# Patient Record
Sex: Male | Born: 1945
Health system: Southern US, Community
[De-identification: ages and names within clinical notes are randomized; demographics above are authoritative.]

## PROBLEM LIST (undated history)

## (undated) DIAGNOSIS — J449 Chronic obstructive pulmonary disease, unspecified: Secondary | ICD-10-CM

## (undated) DIAGNOSIS — J439 Emphysema, unspecified: Secondary | ICD-10-CM

## (undated) DIAGNOSIS — Z9889 Other specified postprocedural states: Secondary | ICD-10-CM

## (undated) DIAGNOSIS — A048 Other specified bacterial intestinal infections: Secondary | ICD-10-CM

## (undated) DIAGNOSIS — I719 Aortic aneurysm of unspecified site, without rupture: Secondary | ICD-10-CM

## (undated) DIAGNOSIS — H269 Unspecified cataract: Secondary | ICD-10-CM

## (undated) HISTORY — DX: Chronic obstructive pulmonary disease, unspecified: J44.9

## (undated) HISTORY — DX: Other specified bacterial intestinal infections: A04.8

## (undated) HISTORY — DX: Unspecified cataract: H26.9

## (undated) HISTORY — PX: HERNIA REPAIR: SHX51

## (undated) HISTORY — PX: FRACTURE SURGERY: SHX138

## (undated) HISTORY — PX: CATARACT EXTRACTION, BILATERAL: SHX1313

---

## 2013-05-23 DIAGNOSIS — C4492 Squamous cell carcinoma of skin, unspecified: Secondary | ICD-10-CM

## 2013-05-23 HISTORY — DX: Squamous cell carcinoma of skin, unspecified: C44.92

## 2013-06-19 ENCOUNTER — Ambulatory Visit (INDEPENDENT_AMBULATORY_CARE_PROVIDER_SITE_OTHER): Payer: Medicare Other

## 2013-06-19 DIAGNOSIS — Z23 Encounter for immunization: Secondary | ICD-10-CM

## 2014-03-01 ENCOUNTER — Encounter: Payer: Self-pay | Admitting: Physician Assistant

## 2014-03-01 ENCOUNTER — Ambulatory Visit (INDEPENDENT_AMBULATORY_CARE_PROVIDER_SITE_OTHER): Payer: Medicare Other | Admitting: Physician Assistant

## 2014-03-01 VITALS — BP 128/72 | HR 89 | Temp 99.0°F | Ht 71.0 in | Wt 168.2 lb

## 2014-03-01 DIAGNOSIS — F17200 Nicotine dependence, unspecified, uncomplicated: Secondary | ICD-10-CM | POA: Insufficient documentation

## 2014-03-01 DIAGNOSIS — Z87820 Personal history of traumatic brain injury: Secondary | ICD-10-CM | POA: Insufficient documentation

## 2014-03-01 DIAGNOSIS — Z0000 Encounter for general adult medical examination without abnormal findings: Secondary | ICD-10-CM

## 2014-03-01 NOTE — Progress Notes (Signed)
Subjective:     Patient ID: Spencer Miller, male   DOB: [birth year], 68 y.o.   MRN: [id number]  HPI Pt here for physical exam He has all of his labs done through the [facility] Last labs done in Dec- see scanned documents He is doing well and has no complaints Pt with a new dog and states life has never been better since  Review of Systems  Constitutional: Negative.   HENT: Negative.   Eyes: Negative.   Respiratory: Negative.   Cardiovascular: Negative.   Gastrointestinal: Negative.   Endocrine: Negative.   Genitourinary: Negative.   Musculoskeletal: Negative.   Skin: Negative.   Allergic/Immunologic: Negative.   Neurological: Negative.   Hematological: Negative.   Psychiatric/Behavioral: Negative.        Objective:   Physical Exam  Constitutional: He is oriented to person, place, and time. He appears well-developed and well-nourished.  HENT:  Head: Normocephalic and atraumatic.  Right Ear: External ear normal.  Left Ear: External ear normal.  Nose: Nose normal.  Mouth/Throat: Oropharynx is clear and moist.  Eyes: Conjunctivae and EOM are normal. Pupils are equal, round, and reactive to light.  Neck: Normal range of motion. Neck supple. No JVD present. No thyromegaly present.  Cardiovascular: Normal rate, regular rhythm, normal heart sounds and intact distal pulses.   Pulmonary/Chest: Effort normal and breath sounds normal.  Abdominal: Soft. Bowel sounds are normal. He exhibits no distension and no mass. There is no tenderness. There is no rebound and no guarding.  Musculoskeletal: Normal range of motion. He exhibits no edema and no tenderness.  Lymphadenopathy:    He has no cervical adenopathy.  Neurological: He is alert and oriented to person, place, and time. He has normal reflexes.  Skin: Skin is warm and dry.  Psychiatric: He has a normal mood and affect. His behavior is normal. Judgment and thought content normal.       Assessment:     Physical exam    Plan:      Reviewed his lab form the [facility] with him Pt is up to date on his regular screening tests through the [facility] He has an appt for regular check with them in the next month Stressed the importance of smoking cessation Current with the current level of exercise F/U pending new labs from the [facility]

## 2014-03-01 NOTE — Patient Instructions (Signed)
Smoking Cessation Quitting smoking is important to your health and has many advantages. However, it is not always easy to quit since nicotine is a very addictive drug. Often times, people try 3 times or more before being able to quit. This document explains the best ways for you to prepare to quit smoking. Quitting takes hard work and a lot of effort, but you can do it. ADVANTAGES OF QUITTING SMOKING  You will live longer, feel better, and live better.  Your body will feel the impact of quitting smoking almost immediately.  Within 20 minutes, blood pressure decreases. Your pulse returns to its normal level.  After 8 hours, carbon monoxide levels in the blood return to normal. Your oxygen level increases.  After 24 hours, the chance of having a heart attack starts to decrease. Your breath, hair, and body stop smelling like smoke.  After 48 hours, damaged nerve endings begin to recover. Your sense of taste and smell improve.  After 72 hours, the body is virtually free of nicotine. Your bronchial tubes relax and breathing becomes easier.  After 2 to 12 weeks, lungs can hold more air. Exercise becomes easier and circulation improves.  The risk of having a heart attack, stroke, cancer, or lung disease is greatly reduced.  After 1 year, the risk of coronary heart disease is cut in half.  After 5 years, the risk of stroke falls to the same as a nonsmoker.  After 10 years, the risk of lung cancer is cut in half and the risk of other cancers decreases significantly.  After 15 years, the risk of coronary heart disease drops, usually to the level of a nonsmoker.  If you are pregnant, quitting smoking will improve your chances of having a healthy baby.  The people you live with, especially any children, will be healthier.  You will have extra money to spend on things other than cigarettes. QUESTIONS TO THINK ABOUT BEFORE ATTEMPTING TO QUIT You may want to talk about your answers with your  caregiver.  Why do you want to quit?  If you tried to quit in the past, what helped and what did not?  What will be the most difficult situations for you after you quit? How will you plan to handle them?  Who can help you through the tough times? Your family? Friends? A caregiver?  What pleasures do you get from smoking? What ways can you still get pleasure if you quit? Here are some questions to ask your caregiver:  How can you help me to be successful at quitting?  What medicine do you think would be best for me and how should I take it?  What should I do if I need more help?  What is smoking withdrawal like? How can I get information on withdrawal? GET READY  Set a quit date.  Change your environment by getting rid of all cigarettes, ashtrays, matches, and lighters in your home, car, or work. Do not let people smoke in your home.  Review your past attempts to quit. Think about what worked and what did not. GET SUPPORT AND ENCOURAGEMENT You have a better chance of being successful if you have help. You can get support in many ways.  Tell your family, friends, and co-workers that you are going to quit and need their support. Ask them not to smoke around you.  Get individual, group, or telephone counseling and support. Programs are available at local hospitals and health centers. Call your local health department for   information about programs in your area.  Spiritual beliefs and practices may help some smokers quit.  Download a "quit meter" on your computer to keep track of quit statistics, such as how long you have gone without smoking, cigarettes not smoked, and money saved.  Get a self-help book about quitting smoking and staying off of tobacco. LEARN NEW SKILLS AND BEHAVIORS  Distract yourself from urges to smoke. Talk to someone, go for a walk, or occupy your time with a task.  Change your normal routine. Take a different route to work. Drink tea instead of coffee.  Eat breakfast in a different place.  Reduce your stress. Take a hot bath, exercise, or read a book.  Plan something enjoyable to do every day. Reward yourself for not smoking.  Explore interactive web-based programs that specialize in helping you quit. GET MEDICINE AND USE IT CORRECTLY Medicines can help you stop smoking and decrease the urge to smoke. Combining medicine with the above behavioral methods and support can greatly increase your chances of successfully quitting smoking.  Nicotine replacement therapy helps deliver nicotine to your body without the negative effects and risks of smoking. Nicotine replacement therapy includes nicotine gum, lozenges, inhalers, nasal sprays, and skin patches. Some may be available over-the-counter and others require a prescription.  Antidepressant medicine helps people abstain from smoking, but how this works is unknown. This medicine is available by prescription.  Nicotinic receptor partial agonist medicine simulates the effect of nicotine in your brain. This medicine is available by prescription. Ask your caregiver for advice about which medicines to use and how to use them based on your health history. Your caregiver will tell you what side effects to look out for if you choose to be on a medicine or therapy. Carefully read the information on the package. Do not use any other product containing nicotine while using a nicotine replacement product.  RELAPSE OR DIFFICULT SITUATIONS Most relapses occur within the first 3 months after quitting. Do not be discouraged if you start smoking again. Remember, most people try several times before finally quitting. You may have symptoms of withdrawal because your body is used to nicotine. You may crave cigarettes, be irritable, feel very hungry, cough often, get headaches, or have difficulty concentrating. The withdrawal symptoms are only temporary. They are strongest when you first quit, but they will go away within  10-14 days. To reduce the chances of relapse, try to:  Avoid drinking alcohol. Drinking lowers your chances of successfully quitting.  Reduce the amount of caffeine you consume. Once you quit smoking, the amount of caffeine in your body increases and can give you symptoms, such as a rapid heartbeat, sweating, and anxiety.  Avoid smokers because they can make you want to smoke.  Do not let weight gain distract you. Many smokers will gain weight when they quit, usually less than 10 pounds. Eat a healthy diet and stay active. You can always lose the weight gained after you quit.  Find ways to improve your mood other than smoking. FOR MORE INFORMATION  www.smokefree.gov  Document Released: 08/18/2001 Document Revised: 02/23/2012 Document Reviewed: 12/03/2011 Exit[employer] Patient Information 2015 [employer]. This information is not intended to replace advice given to you by your health care Sandro Burgo. Make sure you discuss any questions you have with your health care Ennifer Harston.  

## 2014-06-19 ENCOUNTER — Ambulatory Visit (INDEPENDENT_AMBULATORY_CARE_PROVIDER_SITE_OTHER): Payer: Medicare Other

## 2014-06-19 DIAGNOSIS — Z23 Encounter for immunization: Secondary | ICD-10-CM

## 2014-12-03 DIAGNOSIS — L929 Granulomatous disorder of the skin and subcutaneous tissue, unspecified: Secondary | ICD-10-CM | POA: Diagnosis not present

## 2014-12-03 DIAGNOSIS — D239 Other benign neoplasm of skin, unspecified: Secondary | ICD-10-CM | POA: Diagnosis not present

## 2015-02-13 ENCOUNTER — Encounter (INDEPENDENT_AMBULATORY_CARE_PROVIDER_SITE_OTHER): Payer: Self-pay

## 2015-02-13 ENCOUNTER — Encounter: Payer: Self-pay | Admitting: *Deleted

## 2015-02-13 ENCOUNTER — Ambulatory Visit (INDEPENDENT_AMBULATORY_CARE_PROVIDER_SITE_OTHER): Payer: Medicare Other | Admitting: *Deleted

## 2015-02-13 VITALS — BP 104/66 | HR 80 | Resp 20 | Ht 71.0 in | Wt 164.2 lb

## 2015-02-13 DIAGNOSIS — Z1212 Encounter for screening for malignant neoplasm of rectum: Secondary | ICD-10-CM

## 2015-02-13 DIAGNOSIS — Z0000 Encounter for general adult medical examination without abnormal findings: Secondary | ICD-10-CM | POA: Diagnosis not present
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
## 2015-02-13 NOTE — Patient Instructions (Addendum)
Preventive Care for Adults A healthy lifestyle and preventive care can promote health and wellness. Preventive health guidelines for men include the following key practices:  A routine yearly physical is a good way to check with your health care provider about your health and preventative screening. It is a chance to share any concerns and updates on your health and to receive a thorough exam.  Visit your dentist for a routine exam and preventative care every 6 months. Brush your teeth twice a day and floss once a day. Good oral hygiene prevents tooth decay and gum disease.  The frequency of eye exams is based on your age, health, family medical history, use of contact lenses, and other factors. Follow your health care provider's recommendations for frequency of eye exams.  Eat a healthy diet. Foods such as vegetables, fruits, whole grains, low-fat dairy products, and lean protein foods contain the nutrients you need without too many calories. Decrease your intake of foods high in solid fats, added sugars, and salt. Eat the right amount of calories for you.Get information about a proper diet from your health care provider, if necessary.  Regular physical exercise is one of the most important things you can do for your health. Most adults should get at least 150 minutes of moderate-intensity exercise (any activity that increases your heart rate and causes you to sweat) each week. In addition, most adults need muscle-strengthening exercises on 2 or more days a week.  Maintain a healthy weight. The body mass index (BMI) is a screening tool to identify possible weight problems. It provides an estimate of body fat based on height and weight. Your health care provider can find your BMI and can help you achieve or maintain a healthy weight.For adults 20 years and older:  A BMI below 18.5 is considered underweight.  A BMI of 18.5 to 24.9 is normal.  A BMI of 25 to 29.9 is considered overweight.  A BMI  of 30 and above is considered obese.  Maintain normal blood lipids and cholesterol levels by exercising and minimizing your intake of saturated fat. Eat a balanced diet with plenty of fruit and vegetables. Blood tests for lipids and cholesterol should begin at age 50 and be repeated every 5 years. If your lipid or cholesterol levels are high, you are over 50, or you are at high risk for heart disease, you may need your cholesterol levels checked more frequently.Ongoing high lipid and cholesterol levels should be treated with medicines if diet and exercise are not working.  If you smoke, find out from your health care provider how to quit. If you do not use tobacco, do not start.  Lung cancer screening is recommended for adults aged 73-80 years who are at high risk for developing lung cancer because of a history of smoking. A yearly low-dose CT scan of the lungs is recommended for people who have at least a 30-pack-year history of smoking and are a current smoker or have quit within the past 15 years. A pack year of smoking is smoking an average of 1 pack of cigarettes a day for 1 year (for example: 1 pack a day for 30 years or 2 packs a day for 15 years). Yearly screening should continue until the smoker has stopped smoking for at least 15 years. Yearly screening should be stopped for people who develop a health problem that would prevent them from having lung cancer treatment.  If you choose to drink alcohol, do not have more than  2 drinks per day. One drink is considered to be 12 ounces (355 mL) of beer, 5 ounces (148 mL) of wine, or 1.5 ounces (44 mL) of liquor.  Avoid use of street drugs. Do not share needles with anyone. Ask for help if you need support or instructions about stopping the use of drugs.  High blood pressure causes heart disease and increases the risk of stroke. Your blood pressure should be checked at least every 1-2 years. Ongoing high blood pressure should be treated with  medicines, if weight loss and exercise are not effective.  If you are 45-79 years old, ask your health care provider if you should take aspirin to prevent heart disease.  Diabetes screening involves taking a blood sample to check your fasting blood sugar level. This should be done once every 3 years, after age 45, if you are within normal weight and without risk factors for diabetes. Testing should be considered at a younger age or be carried out more frequently if you are overweight and have at least 1 risk factor for diabetes.  Colorectal cancer can be detected and often prevented. Most routine colorectal cancer screening begins at the age of 50 and continues through age 75. However, your health care provider may recommend screening at an earlier age if you have risk factors for colon cancer. On a yearly basis, your health care provider may provide home test kits to check for hidden blood in the stool. Use of a small camera at the end of a tube to directly examine the colon (sigmoidoscopy or colonoscopy) can detect the earliest forms of colorectal cancer. Talk to your health care provider about this at age 50, when routine screening begins. Direct exam of the colon should be repeated every 5-10 years through age 75, unless early forms of precancerous polyps or small growths are found.  People who are at an increased risk for hepatitis B should be screened for this virus. You are considered at high risk for hepatitis B if:  You were born in a country where hepatitis B occurs often. Talk with your health care provider about which countries are considered high risk.  Your parents were born in a high-risk country and you have not received a shot to protect against hepatitis B (hepatitis B vaccine).  You have HIV or AIDS.  You use needles to inject street drugs.  You live with, or have sex with, someone who has hepatitis B.  You are a man who has sex with other men (MSM).  You get hemodialysis  treatment.  You take certain medicines for conditions such as cancer, organ transplantation, and autoimmune conditions.  Hepatitis C blood testing is recommended for all people born from 1945 through 1965 and any individual with known risks for hepatitis C.  Practice safe sex. Use condoms and avoid high-risk sexual practices to reduce the spread of sexually transmitted infections (STIs). STIs include gonorrhea, chlamydia, syphilis, trichomonas, herpes, HPV, and human immunodeficiency virus (HIV). Herpes, HIV, and HPV are viral illnesses that have no cure. They can result in disability, cancer, and death.  If you are at risk of being infected with HIV, it is recommended that you take a prescription medicine daily to prevent HIV infection. This is called preexposure prophylaxis (PrEP). You are considered at risk if:  You are a man who has sex with other men (MSM) and have other risk factors.  You are a heterosexual man, are sexually active, and are at increased risk for HIV infection.    You take drugs by injection.  You are sexually active with a partner who has HIV.  Talk with your health care provider about whether you are at high risk of being infected with HIV. If you choose to begin PrEP, you should first be tested for HIV. You should then be tested every 3 months for as long as you are taking PrEP.  A one-time screening for abdominal aortic aneurysm (AAA) and surgical repair of large AAAs by ultrasound are recommended for men ages 32 to 67 years who are current or former smokers.  Healthy men should no longer receive prostate-specific antigen (PSA) blood tests as part of routine cancer screening. Talk with your health care provider about prostate cancer screening.  Testicular cancer screening is not recommended for adult males who have no symptoms. Screening includes self-exam, a health care provider exam, and other screening tests. Consult with your health care provider about any symptoms  you have or any concerns you have about testicular cancer.  Use sunscreen. Apply sunscreen liberally and repeatedly throughout the day. You should seek shade when your shadow is shorter than you. Protect yourself by wearing long sleeves, pants, a wide-brimmed hat, and sunglasses year round, whenever you are outdoors.  Once a month, do a whole-body skin exam, using a mirror to look at the skin on your back. Tell your health care provider about new moles, moles that have irregular borders, moles that are larger than a pencil eraser, or moles that have changed in shape or color.  Stay current with required vaccines (immunizations).  Influenza vaccine. All adults should be immunized every year.  Tetanus, diphtheria, and acellular pertussis (Td, Tdap) vaccine. An adult who has not previously received Tdap or who does not know his vaccine status should receive 1 dose of Tdap. This initial dose should be followed by tetanus and diphtheria toxoids (Td) booster doses every 10 years. Adults with an unknown or incomplete history of completing a 3-dose immunization series with Td-containing vaccines should begin or complete a primary immunization series including a Tdap dose. Adults should receive a Td booster every 10 years.  Varicella vaccine. An adult without evidence of immunity to varicella should receive 2 doses or a second dose if he has previously received 1 dose.  Human papillomavirus (HPV) vaccine. Males aged 68-21 years who have not received the vaccine previously should receive the 3-dose series. Males aged 22-26 years may be immunized. Immunization is recommended through the age of 6 years for any male who has sex with males and did not get any or all doses earlier. Immunization is recommended for any person with an immunocompromised condition through the age of 49 years if he did not get any or all doses earlier. During the 3-dose series, the second dose should be obtained 4-8 weeks after the first  dose. The third dose should be obtained 24 weeks after the first dose and 16 weeks after the second dose.  Zoster vaccine. One dose is recommended for adults aged 50 years or older unless certain conditions are present.  Measles, mumps, and rubella (MMR) vaccine. Adults born before [birth year] generally are considered immune to measles and mumps. Adults born in [birth year] or later should have 1 or more doses of MMR vaccine unless there is a contraindication to the vaccine or there is laboratory evidence of immunity to each of the three diseases. A routine second dose of MMR vaccine should be obtained at least 28 days after the first dose for students attending postsecondary  schools, health care workers, or international travelers. People who received inactivated measles vaccine or an unknown type of measles vaccine during 1963-1967 should receive 2 doses of MMR vaccine. People who received inactivated mumps vaccine or an unknown type of mumps vaccine before 1979 and are at high risk for mumps infection should consider immunization with 2 doses of MMR vaccine. Unvaccinated health care workers born before 1957 who lack laboratory evidence of measles, mumps, or rubella immunity or laboratory confirmation of disease should consider measles and mumps immunization with 2 doses of MMR vaccine or rubella immunization with 1 dose of MMR vaccine.  Pneumococcal 13-valent conjugate (PCV13) vaccine. When indicated, a person who is uncertain of his immunization history and has no record of immunization should receive the PCV13 vaccine. An adult aged 19 years or older who has certain medical conditions and has not been previously immunized should receive 1 dose of PCV13 vaccine. This PCV13 should be followed with a dose of pneumococcal polysaccharide (PPSV23) vaccine. The PPSV23 vaccine dose should be obtained at least 8 weeks after the dose of PCV13 vaccine. An adult aged 19 years or older who has certain medical conditions and  previously received 1 or more doses of PPSV23 vaccine should receive 1 dose of PCV13. The PCV13 vaccine dose should be obtained 1 or more years after the last PPSV23 vaccine dose.  Pneumococcal polysaccharide (PPSV23) vaccine. When PCV13 is also indicated, PCV13 should be obtained first. All adults aged 65 years and older should be immunized. An adult younger than age 65 years who has certain medical conditions should be immunized. Any person who resides in a nursing home or long-term care facility should be immunized. An adult smoker should be immunized. People with an immunocompromised condition and certain other conditions should receive both PCV13 and PPSV23 vaccines. People with human immunodeficiency virus (HIV) infection should be immunized as soon as possible after diagnosis. Immunization during chemotherapy or radiation therapy should be avoided. Routine use of PPSV23 vaccine is not recommended for American Indians, [place] Natives, or people younger than 65 years unless there are medical conditions that require PPSV23 vaccine. When indicated, people who have unknown immunization and have no record of immunization should receive PPSV23 vaccine. One-time revaccination 5 years after the first dose of PPSV23 is recommended for people aged 19-64 years who have chronic kidney failure, nephrotic syndrome, asplenia, or immunocompromised conditions. People who received 1-2 doses of PPSV23 before age 65 years should receive another dose of PPSV23 vaccine at age 65 years or later if at least 5 years have passed since the previous dose. Doses of PPSV23 are not needed for people immunized with PPSV23 at or after age 65 years.  Meningococcal vaccine. Adults with asplenia or persistent complement component deficiencies should receive 2 doses of quadrivalent meningococcal conjugate (MenACWY-D) vaccine. The doses should be obtained at least 2 months apart. Microbiologists working with certain meningococcal bacteria,  military recruits, people at risk during an outbreak, and people who travel to or live in countries with a high rate of meningitis should be immunized. A first-year college student up through age 21 years who is living in a residence hall should receive a dose if he did not receive a dose on or after his 16th birthday. Adults who have certain high-risk conditions should receive one or more doses of vaccine.  Hepatitis A vaccine. Adults who wish to be protected from this disease, have certain high-risk conditions, work with hepatitis A-infected animals, work in hepatitis A research labs, or   travel to or work in countries with a high rate of hepatitis A should be immunized. Adults who were previously unvaccinated and who anticipate close contact with an international adoptee during the first 60 days after arrival in the [place] from a country with a high rate of hepatitis A should be immunized.  Hepatitis B vaccine. Adults should be immunized if they wish to be protected from this disease, have certain high-risk conditions, may be exposed to blood or other infectious body fluids, are household contacts or sex partners of hepatitis B positive people, are clients or workers in certain care facilities, or travel to or work in countries with a high rate of hepatitis B.  Haemophilus influenzae type b (Hib) vaccine. A previously unvaccinated person with asplenia or sickle cell disease or having a scheduled splenectomy should receive 1 dose of Hib vaccine. Regardless of previous immunization, a recipient of a hematopoietic stem cell transplant should receive a 3-dose series 6-12 months after his successful transplant. Hib vaccine is not recommended for adults with HIV infection. Preventive Service / Frequency Ages 52 to 17  Blood pressure check.** / Every 1 to 2 years.  Lipid and cholesterol check.** / Every 5 years beginning at age 69.  Hepatitis C blood test.** / For any individual with known risks for  hepatitis C.  Skin self-exam. / Monthly.  Influenza vaccine. / Every year.  Tetanus, diphtheria, and acellular pertussis (Tdap, Td) vaccine.** / Consult your health care provider. 1 dose of Td every 10 years.  Varicella vaccine.** / Consult your health care provider.  HPV vaccine. / 3 doses over 6 months, if 72 or younger.  Measles, mumps, rubella (MMR) vaccine.** / You need at least 1 dose of MMR if you were born in 1957 or later. You may also need a second dose.  Pneumococcal 13-valent conjugate (PCV13) vaccine.** / Consult your health care provider.  Pneumococcal polysaccharide (PPSV23) vaccine.** / 1 to 2 doses if you smoke cigarettes or if you have certain conditions.  Meningococcal vaccine.** / 1 dose if you are age 35 to 60 years and a Market researcher living in a residence hall, or have one of several medical conditions. You may also need additional booster doses.  Hepatitis A vaccine.** / Consult your health care provider.  Hepatitis B vaccine.** / Consult your health care provider.  Haemophilus influenzae type b (Hib) vaccine.** / Consult your health care provider. Ages 35 to 8  Blood pressure check.** / Every 1 to 2 years.  Lipid and cholesterol check.** / Every 5 years beginning at age 57.  Lung cancer screening. / Every year if you are aged 44-80 years and have a 30-pack-year history of smoking and currently smoke or have quit within the past 15 years. Yearly screening is stopped once you have quit smoking for at least 15 years or develop a health problem that would prevent you from having lung cancer treatment.  Fecal occult blood test (FOBT) of stool. / Every year beginning at age 55 and continuing until age 73. You may not have to do this test if you get a colonoscopy every 10 years.  Flexible sigmoidoscopy** or colonoscopy.** / Every 5 years for a flexible sigmoidoscopy or every 10 years for a colonoscopy beginning at age 28 and continuing until age  1.  Hepatitis C blood test.** / For all people born from [birth year] through 1965 and any individual with known risks for hepatitis C.  Skin self-exam. / Monthly.  Influenza vaccine. / Every  year.  Tetanus, diphtheria, and acellular pertussis (Tdap/Td) vaccine.** / Consult your health care provider. 1 dose of Td every 10 years.  Varicella vaccine.** / Consult your health care provider.  Zoster vaccine.** / 1 dose for adults aged 73 years or older.  Measles, mumps, rubella (MMR) vaccine.** / You need at least 1 dose of MMR if you were born in 1957 or later. You may also need a second dose.  Pneumococcal 13-valent conjugate (PCV13) vaccine.** / Consult your health care provider.  Pneumococcal polysaccharide (PPSV23) vaccine.** / 1 to 2 doses if you smoke cigarettes or if you have certain conditions.  Meningococcal vaccine.** / Consult your health care provider.  Hepatitis A vaccine.** / Consult your health care provider.  Hepatitis B vaccine.** / Consult your health care provider.  Haemophilus influenzae type b (Hib) vaccine.** / Consult your health care provider. Ages 85 and over  Blood pressure check.** / Every 1 to 2 years.  Lipid and cholesterol check.**/ Every 5 years beginning at age 38.  Lung cancer screening. / Every year if you are aged 99-80 years and have a 30-pack-year history of smoking and currently smoke or have quit within the past 15 years. Yearly screening is stopped once you have quit smoking for at least 15 years or develop a health problem that would prevent you from having lung cancer treatment.  Fecal occult blood test (FOBT) of stool. / Every year beginning at age 16 and continuing until age 26. You may not have to do this test if you get a colonoscopy every 10 years.  Flexible sigmoidoscopy** or colonoscopy.** / Every 5 years for a flexible sigmoidoscopy or every 10 years for a colonoscopy beginning at age 22 and continuing until age 16.  Hepatitis C blood  test.** / For all people born from [birth year] through 1965 and any individual with known risks for hepatitis C.  Abdominal aortic aneurysm (AAA) screening.** / A one-time screening for ages 73 to 33 years who are current or former smokers.  Skin self-exam. / Monthly.  Influenza vaccine. / Every year.  Tetanus, diphtheria, and acellular pertussis (Tdap/Td) vaccine.** / 1 dose of Td every 10 years.  Varicella vaccine.** / Consult your health care provider.  Zoster vaccine.** / 1 dose for adults aged 42 years or older.  Pneumococcal 13-valent conjugate (PCV13) vaccine.** / Consult your health care provider.  Pneumococcal polysaccharide (PPSV23) vaccine.** / 1 dose for all adults aged 36 years and older.  Meningococcal vaccine.** / Consult your health care provider.  Hepatitis A vaccine.** / Consult your health care provider.  Hepatitis B vaccine.** / Consult your health care provider.  Haemophilus influenzae type b (Hib) vaccine.** / Consult your health care provider. **Family history and personal history of risk and conditions may change your health care provider's recommendations. Document Released: 10/20/2001 Document Revised: 08/29/2013 Document Reviewed: 01/19/2011 [facility] Patient Information 2015 [place], [place]. This information is not intended to replace advice given to you by your health care provider. Make sure you discuss any questions you have with your health care provider. Advance Directive Advance directives are the legal documents that allow you to make choices about your health care and medical treatment if you cannot speak for yourself. Advance directives are a way for you to communicate your wishes to family, friends, and health care providers. The specified people can then convey your decisions about end-of-life care to avoid confusion if you should become unable to communicate. Ideally, the process of discussing and writing advance directives should happen  over time rather  than making decisions all at once. Advance directives can be modified as your situation changes, and you can change your mind at any time, even after you have signed the advance directives. Each state has its own laws regarding advance directives. You may want to check with your health care provider, attorney, or state representative about the law in your state. Below are some examples of advance directives. LIVING WILL A living will is a set of instructions documenting your wishes about medical care when you cannot care for yourself. It is used if you become:  Terminally ill.  Incapacitated.  Unable to communicate.  Unable to make decisions. Items to consider in your living will include:  The use or non-use of life-sustaining equipment, such as dialysis machines and breathing machines (ventilators).  A do not resuscitate (DNR) order, which is the instruction not to use cardiopulmonary resuscitation (CPR) if breathing or heartbeat stops.  Tube feeding.  Withholding of food and fluids.  Comfort (palliative) care when the goal becomes comfort rather than a cure.  Organ and tissue donation. A living will does not give instructions about distribution of your money and property if you should pass away. It is advisable to seek the expert advice of a lawyer in drawing up a will regarding your possessions. Decisions about taxes, beneficiaries, and asset distribution will be legally binding. This process can relieve your family and friends of any burdens surrounding disputes or questions that may come up about the allocation of your assets. DO NOT RESUSCITATE (DNR) A do not resuscitate (DNR) order is a request to not have CPR in the event that your heart stops beating or you stop breathing. Unless given other instructions, a health care provider will try to help any patient whose heart has stopped or who has stopped breathing.  HEALTH CARE PROXY AND DURABLE POWER OF ATTORNEY FOR HEALTH CARE A  health care proxy is a person (agent) appointed to make medical decisions for you if you cannot. Generally, people choose someone they know well and trust to represent their preferences when they can no longer do so. You should be sure to ask this person for agreement to act as your agent. An agent may have to exercise judgment in the event of a medical decision for which your wishes are not known. The durable power of attorney for health care is the legal document that names your health care proxy. Once written, it should be:  Signed.  Notarized.  Dated.  Copied.  Witnessed.  Incorporated into your medical record. You may also want to appoint someone to manage your financial affairs if you cannot. This is called a durable power of attorney for finances. It is a separate legal document from the durable power of attorney for health care. You may choose the same person or someone different from your health care proxy to act as your agent in financial matters. Document Released: 12/01/2007 Document Revised: 08/29/2013 Document Reviewed: 01/11/2013 [facility] Patient Information 2015 [place], [place]. This information is not intended to replace advice given to you by your health care provider. Make sure you discuss any questions you have with your health care provider.

## 2015-02-19 ENCOUNTER — Other Ambulatory Visit: Payer: Medicare Other

## 2015-02-19 DIAGNOSIS — Z1212 Encounter for screening for malignant neoplasm of rectum: Secondary | ICD-10-CM | POA: Diagnosis not present

## 2015-02-19 NOTE — Addendum Note (Signed)
Addended by: Pollyann Kennedy F on: 02/19/2015 03:35 PM   Modules accepted: Orders

## 2015-02-19 NOTE — Progress Notes (Signed)
Lab only 

## 2015-02-20 LAB — FECAL OCCULT BLOOD, IMMUNOCHEMICAL: Fecal Occult Bld: NEGATIVE

## 2015-03-10 ENCOUNTER — Encounter: Payer: Self-pay | Admitting: Family Medicine

## 2015-06-06 ENCOUNTER — Ambulatory Visit (INDEPENDENT_AMBULATORY_CARE_PROVIDER_SITE_OTHER): Payer: Medicare Other

## 2015-06-06 DIAGNOSIS — Z23 Encounter for immunization: Secondary | ICD-10-CM | POA: Diagnosis not present

## 2015-11-26 DIAGNOSIS — D485 Neoplasm of uncertain behavior of skin: Secondary | ICD-10-CM | POA: Diagnosis not present

## 2015-11-26 DIAGNOSIS — L570 Actinic keratosis: Secondary | ICD-10-CM | POA: Diagnosis not present

## 2015-11-26 DIAGNOSIS — C44329 Squamous cell carcinoma of skin of other parts of face: Secondary | ICD-10-CM | POA: Diagnosis not present

## 2016-01-09 DIAGNOSIS — D0439 Carcinoma in situ of skin of other parts of face: Secondary | ICD-10-CM | POA: Diagnosis not present

## 2016-03-11 ENCOUNTER — Encounter: Payer: Self-pay | Admitting: Physician Assistant

## 2016-03-11 DIAGNOSIS — Z1211 Encounter for screening for malignant neoplasm of colon: Secondary | ICD-10-CM | POA: Diagnosis not present

## 2016-03-11 DIAGNOSIS — Z1212 Encounter for screening for malignant neoplasm of rectum: Secondary | ICD-10-CM | POA: Diagnosis not present

## 2016-03-20 LAB — COLOGUARD: Cologuard: NEGATIVE

## 2016-05-19 ENCOUNTER — Telehealth: Payer: Self-pay | Admitting: Family Medicine

## 2016-06-02 ENCOUNTER — Ambulatory Visit (INDEPENDENT_AMBULATORY_CARE_PROVIDER_SITE_OTHER): Payer: Medicare Other

## 2016-06-02 DIAGNOSIS — Z23 Encounter for immunization: Secondary | ICD-10-CM | POA: Diagnosis not present

## 2016-07-02 ENCOUNTER — Ambulatory Visit (INDEPENDENT_AMBULATORY_CARE_PROVIDER_SITE_OTHER): Payer: Medicare Other | Admitting: *Deleted

## 2016-07-02 VITALS — BP 125/74 | HR 78 | Ht 71.0 in | Wt 165.0 lb

## 2016-07-02 DIAGNOSIS — Z0000 Encounter for general adult medical examination without abnormal findings: Secondary | ICD-10-CM | POA: Diagnosis not present

## 2016-07-02 NOTE — Patient Instructions (Addendum)
  Mr. Seman ,  Thank you for taking time to come for your Medicare Wellness Visit. I appreciate your ongoing commitment to your health goals. Please review the following plan we discussed and let me know if I can assist you in the future.   Continue to stay active and eat a healthy diet.  Review Advanced Directives. Bring a copy to our office if you have it signed and notarized.   Follow up with Dr Wendi Snipes for physical exam on 07/13/16.  This is a list of the screening recommended for you and due dates:  Health Maintenance  Topic Date Due  .  Hepatitis C: One time screening is recommended by Center for Disease Control  (CDC) for  adults born from [birth year] through 1964.   21-Jan-1946  . Pneumonia vaccines (2 of 2 - PPSV23) 02/26/2016  . Cologuard (Stool DNA test)  03/12/2019  . Tetanus Vaccine  05/26/2021  . Flu Shot  Completed  . Shingles Vaccine  Completed

## 2016-07-06 NOTE — Progress Notes (Signed)
Subjective:   Spencer Miller is a 70 y.o. male who presents for a Subsequent Medicare Annual Wellness Visit. Spencer Miller lives at home with his wife and dog. He is retired from [employer]. He has 4 sons.   Review of Systems  He reports that his health is about the same as last year.   Cardiac Risk Factors include: advanced age (>26men, >23 women);male gender;smoking/ tobacco exposure  Other systems negative.   Objective:    Today's Vitals   07/02/16 1614  BP: 125/74  Pulse: 78  Weight: 165 lb (74.8 kg)  Height: 5\' 11"  (1.803 m)   Body mass index is 23.01 kg/m.  Current Medications (verified) No outpatient encounter prescriptions on file as of 07/02/2016.   No facility-administered encounter medications on file as of 07/02/2016.     Allergies (verified) Review of patient's allergies indicates no known allergies.   History: Past Medical History:  Diagnosis Date  . Cataract    Bilateral    Past Surgical History:  Procedure Laterality Date  . CATARACT EXTRACTION, BILATERAL    . FRACTURE SURGERY    . HERNIA REPAIR     Family History  Problem Relation Age of Onset  . Obesity Mother   . Melanoma Father   . Cancer Brother     stomach  . Obesity Sister   . Obesity Sister   . Obesity Sister   . Healthy Son   . Healthy Son   . Healthy Son   . Healthy Son    Social History   Occupational History  .      Retired [employer] 25 years  .      Retired [employer] 15 years   Social History Main Topics  . Smoking status: Current Every Day Smoker    Packs/day: 1.00    Years: 40.00    Types: Cigarettes  . Smokeless tobacco: Never Used  . Alcohol use 1.2 oz/week    2 Cans of beer per week  . Drug use: No  . Sexual activity: No   Tobacco Counseling Not ready to quite at this time  Activities of Daily Living In your present state of health, do you have any difficulty performing the following activities: 07/02/2016  Hearing? Y  Vision? N    Difficulty concentrating or making decisions? N  Walking or climbing stairs? N  Dressing or bathing? N  Doing errands, shopping? N  Preparing Food and eating ? N  Using the Toilet? N  In the past six months, have you accidently leaked urine? N  Do you have problems with loss of bowel control? N  Managing your Medications? N  Managing your Finances? N  Housekeeping or managing your Housekeeping? N  Some recent data might be hidden    Immunizations and Health Maintenance Immunization History  Administered Date(s) Administered  . Influenza,inj,Quad PF,36+ Mos 06/19/2013, 06/19/2014, 06/06/2015, 06/02/2016  . Pneumococcal Conjugate-13 02/26/2015  . Pneumococcal Polysaccharide-23 01/05/2010  . Tdap 05/27/2011  . Zoster 02/06/2011   Health Maintenance Due  Topic Date Due  . Hepatitis C Screening  [birth year]  . PNA vac Low Risk Adult (2 of 2 - PPSV23) 02/26/2016    Patient Care Team: Timmothy Euler, MD as PCP - General (Family Medicine)   [place] for eye exams.     Assessment:   This is a routine wellness examination for Spencer Miller.   Hearing/Vision screen No hearing or vision deficits noted during exam.  Dietary issues and exercise activities  discussed: Current Exercise Habits: Home exercise routine, Type of exercise: walking, Time (Minutes): 30, Frequency (Times/Week): >7, Weekly Exercise (Minutes/Week): 0, Intensity: Moderate, Exercise limited by: None identified   Reports walking his dog a few times a day and he stays busy around his home. He states that he could use more exercise but doesn't like to "exercise." He prefers to get his exercise through work. For example, he push mows his yard instead of riding.   Goals    . Quit smoking / using tobacco      Depression Screen PHQ 2/9 Scores 07/02/2016 02/13/2015  PHQ - 2 Score 0 0    Fall Risk Fall Risk  07/02/2016 02/13/2015  Falls in the past year? No No    Cognitive Function: MMSE - Mini Mental State Exam  07/02/2016 02/13/2015  Orientation to time 5 5  Orientation to Place 5 5  Registration 3 3  Attention/ Calculation 5 5  Recall 3 2  Language- name 2 objects 2 2  Language- repeat 1 1  Language- follow 3 step command 3 3  Language- read & follow direction 1 1  Write a sentence 1 1  Copy design 1 1  Total score 30 29      normal exam  Screening Tests Health Maintenance  Topic Date Due  . Hepatitis C Screening  [birth year]  . PNA vac Low Risk Adult (2 of 2 - PPSV23) 02/26/2016  . Fecal DNA (Cologuard)  03/12/2019  . TETANUS/TDAP  05/26/2021  . INFLUENZA VACCINE  Completed  . ZOSTAVAX  Completed        Plan:   Physical exam scheduled for 07/13/16 with Dr Wendi Snipes.  During the course of the visit Spencer Miller was educated and counseled about the following appropriate screening and preventive services:   Vaccines to include Pneumoccal-up to date Influenza-up to date, Td-up to date, Zostavax-up to date  Colorectal cancer screening-cologuard done 03/11/16  Cardiovascular disease screening  Diabetes screening  Glaucoma screening-[facility]  Nutrition counseling  Prostate cancer screening- Due at physical exam appt  Smoking cessation counseling  Patient Instructions (the written plan) were given to the patient.   Chong Sicilian, RN  07/06/2016    I have reviewed and agree with the above AWV documentation.   Laroy Apple, MD [facility] 07/09/2016, 9:11 AM

## 2016-07-13 ENCOUNTER — Ambulatory Visit (INDEPENDENT_AMBULATORY_CARE_PROVIDER_SITE_OTHER): Payer: Medicare Other | Admitting: Family Medicine

## 2016-07-13 ENCOUNTER — Encounter: Payer: Self-pay | Admitting: Family Medicine

## 2016-07-13 DIAGNOSIS — N401 Enlarged prostate with lower urinary tract symptoms: Secondary | ICD-10-CM

## 2016-07-13 DIAGNOSIS — N400 Enlarged prostate without lower urinary tract symptoms: Secondary | ICD-10-CM | POA: Insufficient documentation

## 2016-07-13 DIAGNOSIS — N3943 Post-void dribbling: Secondary | ICD-10-CM | POA: Diagnosis not present

## 2016-07-13 NOTE — Progress Notes (Signed)
   HPI  Patient presents today here to discuss prostate tests.  Patient explains that over the last year or so he's had slowly progressive post void dribbling. He denies weak stream, incomplete voiding, or more than one episode of nocturia. He does not necessarily want to try medications but he does want to discuss prostate issues.  He gets his annual exams at the [place]. He has recently had a physical exam at the [place].  He does not take any medications except for oral oregano.  PMH: Smoking status noted ROS: Per HPI  Objective: BP 115/74   Pulse 82   Temp 97.6 F (36.4 C) (Oral)   Ht 5\' 11"  (1.803 m)   Wt 170 lb 3.2 oz (77.2 kg)   BMI 23.74 kg/m  Gen: NAD, alert, cooperative with exam HEENT: NCAT CV: RRR, good S1/S2, no murmur Resp: CTABL, no wheezes, non-labored Ext: No edema, warm Neuro: Alert and oriented, No gross deficits DRE Normal anal sphincter tone Enlarged smooth normal consistency prostate, no tenderness to palpation   Assessment and plan:  # BPH Mild symptoms, ;large on exam I-PSS score= 1 PSA [pending No meds necessary RTC as needed     Orders Placed This Encounter  Procedures  . [place], MD [facility] 07/13/2016, 2:55 PM

## 2016-07-14 LAB — PSA: Prostate Specific Ag, Serum: 0.8 ng/mL (ref 0.0–4.0)

## 2016-11-24 ENCOUNTER — Other Ambulatory Visit (INDEPENDENT_AMBULATORY_CARE_PROVIDER_SITE_OTHER): Payer: PPO

## 2016-11-24 DIAGNOSIS — L720 Epidermal cyst: Secondary | ICD-10-CM | POA: Diagnosis not present

## 2016-11-24 DIAGNOSIS — L821 Other seborrheic keratosis: Secondary | ICD-10-CM | POA: Diagnosis not present

## 2016-11-24 DIAGNOSIS — D492 Neoplasm of unspecified behavior of bone, soft tissue, and skin: Secondary | ICD-10-CM | POA: Diagnosis not present

## 2016-11-24 DIAGNOSIS — Z1211 Encounter for screening for malignant neoplasm of colon: Secondary | ICD-10-CM

## 2016-11-24 DIAGNOSIS — L570 Actinic keratosis: Secondary | ICD-10-CM | POA: Diagnosis not present

## 2016-11-24 DIAGNOSIS — L922 Granuloma faciale [eosinophilic granuloma of skin]: Secondary | ICD-10-CM | POA: Diagnosis not present

## 2016-11-24 DIAGNOSIS — D229 Melanocytic nevi, unspecified: Secondary | ICD-10-CM | POA: Diagnosis not present

## 2016-11-27 LAB — FECAL OCCULT BLOOD, IMMUNOCHEMICAL: Fecal Occult Bld: NEGATIVE

## 2017-09-09 ENCOUNTER — Ambulatory Visit (INDEPENDENT_AMBULATORY_CARE_PROVIDER_SITE_OTHER): Payer: PPO | Admitting: Family Medicine

## 2017-09-09 ENCOUNTER — Encounter: Payer: Self-pay | Admitting: Family Medicine

## 2017-09-09 VITALS — BP 112/76 | HR 95 | Temp 97.4°F | Ht 71.0 in | Wt 163.0 lb

## 2017-09-09 DIAGNOSIS — J0110 Acute frontal sinusitis, unspecified: Secondary | ICD-10-CM | POA: Diagnosis not present

## 2017-09-09 MED ORDER — PREDNISONE 20 MG PO TABS: ORAL_TABLET | ORAL | 0 refills | Status: DC

## 2017-09-09 MED ORDER — AZITHROMYCIN 250 MG PO TABS: ORAL_TABLET | ORAL | 0 refills | Status: DC

## 2017-09-09 NOTE — Progress Notes (Signed)
BP 112/76   Pulse 95   Temp (!) 97.4 F (36.3 C) (Oral)   Ht 5\' 11"  (1.803 m)   Wt 163 lb (73.9 kg)   BMI 22.73 kg/m    Subjective:    Patient ID: Spencer Miller., male    DOB: 1946/02/06, 72 y.o.   MRN: [id number]  HPI: Spencer Miller. is a 72 y.o. male presenting on 09/09/2017 for Sinusitis (sinus congestion, ears stopped up; began after a recent trip to [place])   HPI Sinus congestion and pressure Patient has been having sinus congestion and pressure and feeling like his ears stopped up that is been going on over the past week.  He feels like this started when he took a trip with multiple plane flights to get the pollen but on the way back and only worsened.  He denies having any fevers or chills or shortness of breath or wheezing.  He says mostly the pressure and congestion is in his head and his ears.  He feels like his ears are muffled as well.  He has been using over-the-counter Flonase to help with it which has been helping some but has not cleared it up.  Relevant past medical, surgical, family and social history reviewed and updated as indicated. Interim medical history since our last visit reviewed. Allergies and medications reviewed and updated.  Review of Systems  Constitutional: Negative for chills and fever.  HENT: Positive for congestion, ear pain, postnasal drip, rhinorrhea, sinus pressure and sore throat. Negative for ear discharge, sneezing and voice change.   Eyes: Negative for pain, discharge, redness and visual disturbance.  Respiratory: Positive for cough. Negative for shortness of breath and wheezing.   Cardiovascular: Negative for chest pain and leg swelling.  Musculoskeletal: Negative for gait problem.  Skin: Negative for rash.  All other systems reviewed and are negative.   Per HPI unless specifically indicated above        Objective:    BP 112/76   Pulse 95   Temp (!) 97.4 F (36.3 C) (Oral)   Ht 5\' 11"  (1.803 m)   Wt 163 lb (73.9 kg)    BMI 22.73 kg/m   Wt Readings from Last 3 Encounters:  09/09/17 163 lb (73.9 kg)  07/13/16 170 lb 3.2 oz (77.2 kg)  07/02/16 165 lb (74.8 kg)    Physical Exam  Constitutional: He is oriented to person, place, and time. He appears well-developed and well-nourished. No distress.  HENT:  Right Ear: Tympanic membrane, external ear and ear canal normal.  Left Ear: Tympanic membrane, external ear and ear canal normal.  Nose: Mucosal edema and rhinorrhea present. No sinus tenderness. No epistaxis. Right sinus exhibits maxillary sinus tenderness and frontal sinus tenderness. Left sinus exhibits maxillary sinus tenderness and frontal sinus tenderness.  Mouth/Throat: Uvula is midline and mucous membranes are normal. Posterior oropharyngeal edema and posterior oropharyngeal erythema present. No oropharyngeal exudate or tonsillar abscesses.  Eyes: Conjunctivae and EOM are normal. Pupils are equal, round, and reactive to light. No scleral icterus.  Neck: Neck supple. No thyromegaly present.  Cardiovascular: Normal rate, regular rhythm, normal heart sounds and intact distal pulses.  No murmur heard. Pulmonary/Chest: Effort normal and breath sounds normal. No respiratory distress. He has no wheezes. He has no rales.  Musculoskeletal: Normal range of motion. He exhibits no edema.  Lymphadenopathy:    He has no cervical adenopathy.  Neurological: He is alert and oriented to person, place, and time. Coordination normal.  Skin: Skin is warm and dry. No rash noted. He is not diaphoretic.  Psychiatric: He has a normal mood and affect. His behavior is normal.  Nursing note and vitals reviewed.       Assessment & Plan:   Problem List Items Addressed This Visit    None    Visit Diagnoses    Acute non-recurrent frontal sinusitis    -  Primary   Relevant Medications   fluticasone (FLONASE) 50 MCG/ACT nasal spray   phenylephrine (SUDAFED PE) 10 MG TABS tablet   azithromycin (ZITHROMAX) 250 MG tablet     predniSONE (DELTASONE) 20 MG tablet       Follow up plan: Return if symptoms worsen or fail to improve.  Counseling provided for all of the vaccine components No orders of the defined types were placed in this encounter.   Caryl Pina, MD [facility] 09/09/2017, 10:11 AM

## 2017-09-19 ENCOUNTER — Encounter: Payer: Self-pay | Admitting: Family Medicine

## 2017-09-19 DIAGNOSIS — J0110 Acute frontal sinusitis, unspecified: Secondary | ICD-10-CM

## 2017-09-20 MED ORDER — FLUTICASONE PROPIONATE 50 MCG/ACT NA SUSP: 2.0000 | Freq: Every day | NASAL | 2 refills | Status: DC

## 2017-09-20 MED ORDER — PREDNISONE 20 MG PO TABS: ORAL_TABLET | ORAL | 0 refills | Status: DC

## 2017-09-28 ENCOUNTER — Encounter: Payer: Self-pay | Admitting: Family Medicine

## 2017-09-28 DIAGNOSIS — H9193 Unspecified hearing loss, bilateral: Secondary | ICD-10-CM

## 2017-10-08 DIAGNOSIS — H6523 Chronic serous otitis media, bilateral: Secondary | ICD-10-CM | POA: Diagnosis not present

## 2017-10-08 DIAGNOSIS — H6993 Unspecified Eustachian tube disorder, bilateral: Secondary | ICD-10-CM | POA: Diagnosis not present

## 2017-11-23 DIAGNOSIS — D229 Melanocytic nevi, unspecified: Secondary | ICD-10-CM | POA: Diagnosis not present

## 2017-11-23 DIAGNOSIS — L821 Other seborrheic keratosis: Secondary | ICD-10-CM | POA: Diagnosis not present

## 2017-12-02 ENCOUNTER — Other Ambulatory Visit: Payer: PPO

## 2017-12-02 DIAGNOSIS — Z1211 Encounter for screening for malignant neoplasm of colon: Secondary | ICD-10-CM

## 2017-12-02 NOTE — Addendum Note (Signed)
Addended by: Earlene Plater on: 12/02/2017 01:49 PM   Modules accepted: Orders

## 2017-12-03 LAB — FECAL OCCULT BLOOD, IMMUNOCHEMICAL: FECAL OCCULT BLD: NEGATIVE

## 2018-02-21 ENCOUNTER — Encounter: Payer: Self-pay | Admitting: Family Medicine

## 2018-02-21 ENCOUNTER — Ambulatory Visit (INDEPENDENT_AMBULATORY_CARE_PROVIDER_SITE_OTHER): Payer: PPO | Admitting: Family Medicine

## 2018-02-21 VITALS — BP 126/75 | HR 92 | Temp 98.9°F | Wt 163.0 lb

## 2018-02-21 DIAGNOSIS — Z720 Tobacco use: Secondary | ICD-10-CM

## 2018-02-21 DIAGNOSIS — N401 Enlarged prostate with lower urinary tract symptoms: Secondary | ICD-10-CM | POA: Diagnosis not present

## 2018-02-21 DIAGNOSIS — Z0000 Encounter for general adult medical examination without abnormal findings: Secondary | ICD-10-CM

## 2018-02-21 DIAGNOSIS — N3943 Post-void dribbling: Secondary | ICD-10-CM | POA: Diagnosis not present

## 2018-02-21 NOTE — Progress Notes (Signed)
   HPI  Patient presents today here for annual physical exam.  Patient feels well and has no complaints overall.  He wants his prostate checked, he has only one episode of nocturia per night, he has some post void dribbling. He is considering seeing urology, he will discuss this with his wife.  They both have really enjoyed seeing Dr. Alyson Ingles.  He is a smoker, he complains of persistent smoker's cough He is considering quitting smoking but not willing at this time.  PMH: Smoking status noted ROS: Per HPI  Objective: BP 126/75   Pulse 92   Temp 98.9 F (37.2 C)   Wt 163 lb (73.9 kg)   BMI 22.73 kg/m  Gen: NAD, alert, cooperative with exam HEENT: NCAT, EOMI, PERRL CV: RRR, good S1/S2, no murmur Resp: CTABL, no wheezes, non-labored Abd: SNTND, BS present, no guarding or organomegaly Ext: No edema, warm Neuro: Alert and oriented, No gross deficits DRE: Large with normal textured prostate  Assessment and plan:  #Annual physical exam Normal exam Labs today Patient complaining of easily fatigued more than usual, checking CBC and TSH to address this more directly  #BPH Well-controlled, no medications PSA Prostate exam is consistent with BPH  #Tobacco use 40+-pack-year history Low-dose CT Recommended cessation     Orders Placed This Encounter  Procedures  . CT CHEST LUNG CA SCREEN LOW DOSE W/O CM    Standing Status:   Future    Standing Expiration Date:   04/24/2019    Order Specific Question:   Reason for Exam (SYMPTOM  OR DIAGNOSIS REQUIRED)    Answer:   Smoker 40+ Pack year Hx    Order Specific Question:   Preferred Imaging Location?    Answer:   [facility]    Order Specific Question:   Radiology Contrast Protocol - do NOT remove file path    Answer:   [email or web address]  . CBC with Differential/Platelet  . CMP14+EGFR  . Lipid panel  . TSH  . [place], MD [facility] 02/21/2018,  11:51 AM

## 2018-02-21 NOTE — Patient Instructions (Signed)
Great to see you!  I am glad to refer you to Dr. Alyson Ingles if you would like, but your BPH is very straightforward.

## 2018-02-22 ENCOUNTER — Encounter: Payer: Self-pay | Admitting: Family Medicine

## 2018-02-22 LAB — LIPID PANEL
CHOL/HDL RATIO: 2.7 ratio (ref 0.0–5.0)
Cholesterol, Total: 168 mg/dL (ref 100–199)
HDL: 62 mg/dL (ref >39)
LDL Calculated: 92 mg/dL (ref 0–99)
Triglycerides: 72 mg/dL (ref 0–149)
VLDL CHOLESTEROL CAL: 14 mg/dL (ref 5–40)

## 2018-02-22 LAB — CMP14+EGFR
ALBUMIN: 4.2 g/dL (ref 3.5–4.8)
ALK PHOS: 71 IU/L (ref 39–117)
ALT: 13 IU/L (ref 0–44)
AST: 17 IU/L (ref 0–40)
Albumin/Globulin Ratio: 1.8 (ref 1.2–2.2)
BUN/Creatinine Ratio: 14 (ref 10–24)
BUN: 12 mg/dL (ref 8–27)
Bilirubin Total: 0.5 mg/dL (ref 0.0–1.2)
CALCIUM: 9.3 mg/dL (ref 8.6–10.2)
CO2: 24 mmol/L (ref 20–29)
CREATININE: 0.86 mg/dL (ref 0.76–1.27)
Chloride: 98 mmol/L (ref 96–106)
GFR calc Af Amer: 101 mL/min/{1.73_m2} (ref >59)
GFR, EST NON AFRICAN AMERICAN: 87 mL/min/{1.73_m2} (ref >59)
GLUCOSE: 82 mg/dL (ref 65–99)
Globulin, Total: 2.4 g/dL (ref 1.5–4.5)
Potassium: 4.9 mmol/L (ref 3.5–5.2)
Sodium: 136 mmol/L (ref 134–144)
Total Protein: 6.6 g/dL (ref 6.0–8.5)

## 2018-02-22 LAB — CBC WITH DIFFERENTIAL/PLATELET
BASOS ABS: 0 10*3/uL (ref 0.0–0.2)
Basos: 0 %
EOS (ABSOLUTE): 0.2 10*3/uL (ref 0.0–0.4)
EOS: 2 %
HEMATOCRIT: 41.4 % (ref 37.5–51.0)
HEMOGLOBIN: 13.9 g/dL (ref 13.0–17.7)
IMMATURE GRANULOCYTES: 0 %
Immature Grans (Abs): 0 10*3/uL (ref 0.0–0.1)
Lymphocytes Absolute: 3.7 10*3/uL — ABNORMAL HIGH (ref 0.7–3.1)
Lymphs: 36 %
MCH: 31.7 pg (ref 26.6–33.0)
MCHC: 33.6 g/dL (ref 31.5–35.7)
MCV: 94 fL (ref 79–97)
MONOCYTES: 6 %
Monocytes Absolute: 0.6 10*3/uL (ref 0.1–0.9)
NEUTROS PCT: 56 %
Neutrophils Absolute: 5.7 10*3/uL (ref 1.4–7.0)
Platelets: 309 10*3/uL (ref 150–450)
RBC: 4.39 x10E6/uL (ref 4.14–5.80)
RDW: 13.7 % (ref 12.3–15.4)
WBC: 10.2 10*3/uL (ref 3.4–10.8)

## 2018-02-22 LAB — PSA: Prostate Specific Ag, Serum: 0.9 ng/mL (ref 0.0–4.0)

## 2018-02-22 LAB — TSH: TSH: 1.03 u[IU]/mL (ref 0.450–4.500)

## 2018-03-17 ENCOUNTER — Encounter: Payer: Self-pay | Admitting: Family Medicine

## 2018-03-17 ENCOUNTER — Ambulatory Visit (HOSPITAL_COMMUNITY)
Admission: RE | Admit: 2018-03-17 | Discharge: 2018-03-17 | Disposition: A | Discharge status: Home / Self Care | Payer: PPO | Source: Ambulatory Visit | Attending: Family Medicine | Admitting: Family Medicine

## 2018-03-17 DIAGNOSIS — J438 Other emphysema: Secondary | ICD-10-CM | POA: Diagnosis not present

## 2018-03-17 DIAGNOSIS — Z720 Tobacco use: Secondary | ICD-10-CM

## 2018-03-17 DIAGNOSIS — J432 Centrilobular emphysema: Secondary | ICD-10-CM | POA: Diagnosis not present

## 2018-03-17 DIAGNOSIS — F17210 Nicotine dependence, cigarettes, uncomplicated: Secondary | ICD-10-CM | POA: Diagnosis not present

## 2018-03-17 DIAGNOSIS — I77810 Thoracic aortic ectasia: Secondary | ICD-10-CM | POA: Insufficient documentation

## 2018-03-17 DIAGNOSIS — Z87891 Personal history of nicotine dependence: Secondary | ICD-10-CM | POA: Diagnosis not present

## 2018-03-17 DIAGNOSIS — R918 Other nonspecific abnormal finding of lung field: Secondary | ICD-10-CM | POA: Insufficient documentation

## 2018-03-17 DIAGNOSIS — I700 Atherosclerosis of aorta: Secondary | ICD-10-CM | POA: Diagnosis not present

## 2018-04-06 DIAGNOSIS — L821 Other seborrheic keratosis: Secondary | ICD-10-CM | POA: Diagnosis not present

## 2018-04-06 DIAGNOSIS — D229 Melanocytic nevi, unspecified: Secondary | ICD-10-CM | POA: Diagnosis not present

## 2018-07-27 ENCOUNTER — Ambulatory Visit (INDEPENDENT_AMBULATORY_CARE_PROVIDER_SITE_OTHER): Payer: PPO

## 2018-07-27 ENCOUNTER — Encounter: Payer: Self-pay | Admitting: Family Medicine

## 2018-07-27 ENCOUNTER — Ambulatory Visit (INDEPENDENT_AMBULATORY_CARE_PROVIDER_SITE_OTHER): Payer: PPO | Admitting: Family Medicine

## 2018-07-27 VITALS — BP 106/59 | HR 81 | Temp 97.5°F | Ht 71.0 in | Wt 164.0 lb

## 2018-07-27 DIAGNOSIS — Z720 Tobacco use: Secondary | ICD-10-CM

## 2018-07-27 DIAGNOSIS — J069 Acute upper respiratory infection, unspecified: Secondary | ICD-10-CM

## 2018-07-27 DIAGNOSIS — J441 Chronic obstructive pulmonary disease with (acute) exacerbation: Secondary | ICD-10-CM

## 2018-07-27 DIAGNOSIS — J439 Emphysema, unspecified: Secondary | ICD-10-CM | POA: Diagnosis not present

## 2018-07-27 MED ORDER — METHYLPREDNISOLONE ACETATE 80 MG/ML IJ SUSP
60.0000 mg | Freq: Once | INTRAMUSCULAR | Status: AC
Start: 2018-07-27 — End: 2018-07-27
  Administered 2018-07-27: 60 mg via INTRAMUSCULAR

## 2018-07-27 MED ORDER — DOXYCYCLINE HYCLATE 100 MG PO TABS: 100.0000 mg | ORAL_TABLET | Freq: Two times a day (BID) | ORAL | 0 refills | Status: DC

## 2018-07-27 MED ORDER — PREDNISONE 10 MG PO TABS: ORAL_TABLET | ORAL | 0 refills | Status: DC

## 2018-07-27 NOTE — Progress Notes (Signed)
Subjective:    Patient ID: [name], male    DOB: [birth year], 72 y.o.   MRN: [id number]  HPI Patient here today for possible URI that started about 12 days ago.  Patient comes in today with symptoms of cough congestion swollen throat sneezing drainage and no fever.  This is been going on for about 12 days.  The patient is currently on no medications.  Patient says this seems to be a yearly occurrence in the fall.  He is not been running any fever or having any wheezing.  He is still smoking about a pack of cigarettes a day.  The drainage is mostly clear in color.  He has Flonase at home.  He is not not had any chest pain or shortness of breath anymore than usual and no problems with his stomach.  He does have some urinary frequency but no burning symptoms.    Patient Active Problem List   Diagnosis Date Noted  . Ascending aorta dilatation (HCC) 03/17/2018  . Pulmonary nodules 03/17/2018  . BPH (benign prostatic hyperplasia) 07/13/2016  . Smoking addiction 03/01/2014  . Hx of traumatic brain injury 03/01/2014   Outpatient Encounter Medications as of 07/27/2018  Medication Sig  . UNABLE TO FIND Med Name: oil of oregano  . [DISCONTINUED] azithromycin (ZITHROMAX) 250 MG tablet Take 2 the first day and then one each day after.  . [DISCONTINUED] fluticasone (FLONASE) 50 MCG/ACT nasal spray Place 2 sprays into both nostrils daily.  . [DISCONTINUED] phenylephrine (SUDAFED PE) 10 MG TABS tablet Take 10 mg by mouth every 6 (six) hours as needed.  . [DISCONTINUED] predniSONE (DELTASONE) 20 MG tablet 2 po at same time daily for 5 days   No facility-administered encounter medications on file as of 07/27/2018.      Review of Systems  Constitutional: Negative.  Negative for fever.  HENT: Positive for congestion, postnasal drip and sneezing.   Eyes: Negative.   Respiratory: Positive for cough.   Cardiovascular: Negative.   Gastrointestinal: Negative.   Endocrine: Negative.     Genitourinary: Negative.   Musculoskeletal: Negative.   Skin: Negative.   Allergic/Immunologic: Negative.   Neurological: Negative.   Hematological: Negative.   Psychiatric/Behavioral: Negative.        Objective:   Physical Exam  Constitutional: He is oriented to person, place, and time. He appears well-developed and well-nourished. No distress.  Patient is alert and kyphotic.  HENT:  Head: Normocephalic and atraumatic.  Right Ear: External ear normal.  Left Ear: External ear normal.  Mouth/Throat: Oropharynx is clear and moist. No oropharyngeal exudate.  Nasal turbinate congestion bilaterally  Eyes: Pupils are equal, round, and reactive to light. Conjunctivae and EOM are normal. Right eye exhibits no discharge. Left eye exhibits no discharge. No scleral icterus.  Neck: Normal range of motion. Neck supple. No thyromegaly present.  No anterior cervical adenopathy  Cardiovascular: Normal rate, regular rhythm, normal heart sounds and intact distal pulses.  No murmur heard. Heart is regular at 72/min  Pulmonary/Chest: Effort normal and breath sounds normal. He has no wheezes. He has no rales.  No rales or wheezes and slightly tight cough  Abdominal: Soft. Bowel sounds are normal. He exhibits no mass. There is no tenderness.  Musculoskeletal: Normal range of motion. He exhibits no edema.  Lymphadenopathy:    He has no cervical adenopathy.  Neurological: He is alert and oriented to person, place, and time. He has normal reflexes. No cranial nerve deficit.  Skin: Skin is  warm and dry. No rash noted.  Psychiatric: He has a normal mood and affect. His behavior is normal. Judgment and thought content normal.  No abnormal mood affect behavior or thought processes.  Nursing note and vitals reviewed.     BP (!) 106/59 (BP Location: Left Arm)   Pulse 81   Temp (!) 97.5 F (36.4 C) (Oral)   Ht 5\' 11"  (1.803 m)   Wt 164 lb (74.4 kg)   BMI 22.87 kg/m      Assessment & Plan:  1.  COPD with exacerbation ([place]) -Take doxycycline 1 twice daily for cough and congestion with food -Use Mucinex maximum strength, 1 twice daily with a large glass of water.  Plain Mucinex -Prednisone taper. -Depo-Medrol IM -Restart Flonase -Use nasal saline 1 spray each nostril 3-4 times daily -CBC and chest x-ray  2. URI, acute -Drink plenty of fluids take meds as above  3. Nicotine abuse -Reduce and try to stop smoking  No orders of the defined types were placed in this encounter.  Patient Instructions  Drink plenty of fluids and stay well-hydrated Take Mucinex maximum strength, blue and white in color, plain and take 1 twice daily with a large glass of water Take antibiotic twice daily with food until completed Use nasal saline frequently in each nostril throughout the day Reduce cigarette smoking is much as possible Take prednisone as directed We will call with CBC and chest x-ray results as soon as these results become available If you get worse and are not feeling better by the first of next week, please get back in touch with [place]  Arrie Senate MD

## 2018-07-27 NOTE — Patient Instructions (Addendum)
Drink plenty of fluids and stay well-hydrated Take Mucinex maximum strength, blue and white in color, plain and take 1 twice daily with a large glass of water Take antibiotic twice daily with food until completed Use nasal saline frequently in each nostril throughout the day Reduce cigarette smoking is much as possible Take prednisone as directed We will call with CBC and chest x-ray results as soon as these results become available If you get worse and are not feeling better by the first of next week, please get back in touch with [place]

## 2018-07-28 ENCOUNTER — Encounter: Payer: Self-pay | Admitting: Family Medicine

## 2018-07-28 DIAGNOSIS — J439 Emphysema, unspecified: Secondary | ICD-10-CM | POA: Insufficient documentation

## 2018-07-28 DIAGNOSIS — I700 Atherosclerosis of aorta: Secondary | ICD-10-CM | POA: Insufficient documentation

## 2018-07-28 LAB — CBC WITH DIFFERENTIAL/PLATELET
BASOS ABS: 0.1 10*3/uL (ref 0.0–0.2)
Basos: 1 %
EOS (ABSOLUTE): 0.3 10*3/uL (ref 0.0–0.4)
EOS: 3 %
HEMATOCRIT: 39.7 % (ref 37.5–51.0)
HEMOGLOBIN: 13.5 g/dL (ref 13.0–17.7)
IMMATURE GRANS (ABS): 0 10*3/uL (ref 0.0–0.1)
IMMATURE GRANULOCYTES: 0 %
LYMPHS ABS: 3.5 10*3/uL — AB (ref 0.7–3.1)
LYMPHS: 33 %
MCH: 32.2 pg (ref 26.6–33.0)
MCHC: 34 g/dL (ref 31.5–35.7)
MCV: 95 fL (ref 79–97)
MONOCYTES: 6 %
Monocytes Absolute: 0.6 10*3/uL (ref 0.1–0.9)
Neutrophils Absolute: 6 10*3/uL (ref 1.4–7.0)
Neutrophils: 57 %
Platelets: 285 10*3/uL (ref 150–450)
RBC: 4.19 x10E6/uL (ref 4.14–5.80)
RDW: 12.3 % (ref 12.3–15.4)
WBC: 10.4 10*3/uL (ref 3.4–10.8)

## 2018-08-16 DIAGNOSIS — J310 Chronic rhinitis: Secondary | ICD-10-CM | POA: Diagnosis not present

## 2018-08-24 ENCOUNTER — Ambulatory Visit: Payer: PPO | Admitting: Family Medicine

## 2018-10-19 ENCOUNTER — Ambulatory Visit (INDEPENDENT_AMBULATORY_CARE_PROVIDER_SITE_OTHER): Payer: PPO | Admitting: Family Medicine

## 2018-10-19 ENCOUNTER — Telehealth: Payer: Self-pay | Admitting: Family Medicine

## 2018-10-19 ENCOUNTER — Encounter: Payer: Self-pay | Admitting: Family Medicine

## 2018-10-19 VITALS — BP 137/72 | HR 99 | Temp 100.1°F | Ht 71.0 in | Wt 165.0 lb

## 2018-10-19 DIAGNOSIS — R6889 Other general symptoms and signs: Secondary | ICD-10-CM | POA: Diagnosis not present

## 2018-10-19 DIAGNOSIS — E860 Dehydration: Secondary | ICD-10-CM | POA: Diagnosis not present

## 2018-10-19 DIAGNOSIS — J101 Influenza due to other identified influenza virus with other respiratory manifestations: Secondary | ICD-10-CM

## 2018-10-19 DIAGNOSIS — R000 Tachycardia, unspecified: Secondary | ICD-10-CM

## 2018-10-19 LAB — VERITOR FLU A/B WAIVED
INFLUENZA A: POSITIVE — AB
Influenza B: NEGATIVE

## 2018-10-19 MED ORDER — OSELTAMIVIR PHOSPHATE 75 MG PO CAPS: 75.0000 mg | ORAL_CAPSULE | Freq: Two times a day (BID) | ORAL | 0 refills | Status: AC

## 2018-10-19 MED ORDER — BENZONATATE 100 MG PO CAPS: 100.0000 mg | ORAL_CAPSULE | Freq: Three times a day (TID) | ORAL | 0 refills | Status: DC | PRN

## 2018-10-19 NOTE — Patient Instructions (Signed)
He was positive for flu a today.  He was given a bolus if IV fluids.  Make sure you continue to drink plenty of fluids.  If symptoms, worsen go to the emergency department.   Influenza, Adult Influenza is also called "the flu." It is an infection in the lungs, nose, and throat (respiratory tract). It is caused by a virus. The flu causes symptoms that are similar to symptoms of a cold. It also causes a high fever and body aches. The flu spreads easily from person to person (is contagious). Getting a flu shot (influenza vaccination) every year is the best way to prevent the flu. What are the causes? This condition is caused by the influenza virus. You can get the virus by:  Breathing in droplets that are in the air from the cough or sneeze of a person who has the virus.  Touching something that has the virus on it (is contaminated) and then touching your mouth, nose, or eyes. What increases the risk? Certain things may make you more likely to get the flu. These include:  Not washing your hands often.  Having close contact with many people during cold and flu season.  Touching your mouth, eyes, or nose without first washing your hands.  Not getting a flu shot every year. You may have a higher risk for the flu, along with serious problems such as a lung infection (pneumonia), if you:  Are older than 65.  Are pregnant.  Have a weakened disease-fighting system (immune system) because of a disease or taking certain medicines.  Have a long-term (chronic) illness, such as: ? Heart, kidney, or lung disease. ? Diabetes. ? Asthma.  Have a liver disorder.  Are very overweight (morbidly obese).  Have anemia. This is a condition that affects your red blood cells. What are the signs or symptoms? Symptoms usually begin suddenly and last 4-14 days. They may include:  Fever and chills.  Headaches, body aches, or muscle aches.  Sore throat.  Cough.  Runny or stuffy (congested)  nose.  Chest discomfort.  Not wanting to eat as much as normal (poor appetite).  Weakness or feeling tired (fatigue).  Dizziness.  Feeling sick to your stomach (nauseous) or throwing up (vomiting). How is this treated? If the flu is found early, you can be treated with medicine that can help reduce how bad the illness is and how long it lasts (antiviral medicine). This may be given by mouth (orally) or through an IV tube. Taking care of yourself at home can help your symptoms get better. Your doctor may suggest:  Taking over-the-counter medicines.  Drinking plenty of fluids. The flu often goes away on its own. If you have very bad symptoms or other problems, you may be treated in a hospital. Follow these instructions at home:     Activity  Rest as needed. Get plenty of sleep.  Stay home from work or school as told by your doctor. ? Do not leave home until you do not have a fever for 24 hours without taking medicine. ? Leave home only to visit your doctor. Eating and drinking  Take an ORS (oral rehydration solution). This is a drink that is sold at pharmacies and stores.  Drink enough fluid to keep your pee (urine) pale yellow.  Drink clear fluids in small amounts as you are able. Clear fluids include: ? Water. ? Ice chips. ? Fruit juice that has water added (diluted fruit juice). ? Low-calorie sports drinks.  Eat bland,  easy-to-digest foods in small amounts as you are able. These foods include: ? Bananas. ? Applesauce. ? Rice. ? Lean meats. ? Toast. ? Crackers.  Do not eat or drink: ? Fluids that have a lot of sugar or caffeine. ? Alcohol. ? Spicy or fatty foods. General instructions  Take over-the-counter and prescription medicines only as told by your doctor.  Use a cool mist humidifier to add moisture to the air in your home. This can make it easier for you to breathe.  Cover your mouth and nose when you cough or sneeze.  Wash your hands with soap and  water often, especially after you cough or sneeze. If you cannot use soap and water, use alcohol-based hand sanitizer.  Keep all follow-up visits as told by your doctor. This is important. How is this prevented?   Get a flu shot every year. You may get the flu shot in late summer, fall, or winter. Ask your doctor when you should get your flu shot.  Avoid contact with people who are sick during fall and winter (cold and flu season). Contact a doctor if:  You get new symptoms.  You have: ? Chest pain. ? Watery poop (diarrhea). ? A fever.  Your cough gets worse.  You start to have more mucus.  You feel sick to your stomach.  You throw up. Get help right away if you:  Have shortness of breath.  Have trouble breathing.  Have skin or nails that turn a bluish color.  Have very bad pain or stiffness in your neck.  Get a sudden headache.  Get sudden pain in your face or ear.  Cannot eat or drink without throwing up. Summary  Influenza ("the flu") is an infection in the lungs, nose, and throat. It is caused by a virus.  Take over-the-counter and prescription medicines only as told by your doctor.  Getting a flu shot every year is the best way to avoid getting the flu. This information is not intended to replace advice given to you by your health care provider. Make sure you discuss any questions you have with your health care provider. Document Released: 06/02/2008 Document Revised: 02/09/2018 Document Reviewed: 02/09/2018 Elsevier Interactive Patient Education  2019 [employer].

## 2018-10-19 NOTE — Progress Notes (Signed)
Subjective: CC: Fever/fatigue PCP: Dettinger, Fransisca Kaufmann, MD FIE:PPIRJJ Spencer Miller. is Spencer 73 y.o. male presenting to clinic today for:  1.  Fever/fatigue Patient is accompanied by his wife to the visit who provides most of the information.  She notes that he had onset of mild cough Monday.  He started having increased mucus production and then yesterday had Spencer fever to 101 F.  He is tolerating fluids but does report quite Spencer bit of fatigue.  No nausea, vomiting, diarrhea.  He has been sneezing.  He has been taking Tylenol with last dose 2 hours ago.  Last dose was 325 mg.  Of note, she reports that he was recently on Spencer cruise at [employer] in the [place].  She did contact EMS earlier today because he was feeling so poorly but they declined transport to the hospital because they did not think he appeared ill enough.  She is worried about dehydration.   ROS: Per HPI  No Known Allergies Past Medical History:  Diagnosis Date  . Cataract    Bilateral    No current outpatient medications on file. Social History   Socioeconomic History  . Marital status: Married    Spouse name: Not on file  . Number of children: 4  . Years of education: Not on file  . Highest education level: Not on file  Occupational History    Comment: Retired [employer] 25 years    Comment: Retired [employer] 15 years  Social Needs  . Financial resource strain: Not on file  . Food insecurity:    Worry: Not on file    Inability: Not on file  . Transportation needs:    Medical: Not on file    Non-medical: Not on file  Tobacco Use  . Smoking status: Current Every Day Smoker    Packs/day: 1.00    Years: 40.00    Pack years: 40.00    Types: Cigarettes  . Smokeless tobacco: Never Used  Substance and Sexual Activity  . Alcohol use: Yes    Alcohol/week: 14.0 standard drinks    Types: 14 Cans of beer per week  . Drug use: No  . Sexual activity: Not Currently  Lifestyle  . Physical activity:    Days  per week: Not on file    Minutes per session: Not on file  . Stress: Not on file  Relationships  . Social connections:    Talks on phone: Not on file    Gets together: Not on file    Attends religious service: Not on file    Active member of club or organization: Not on file    Attends meetings of clubs or organizations: Not on file    Relationship status: Not on file  . Intimate partner violence:    Fear of current or ex partner: Not on file    Emotionally abused: Not on file    Physically abused: Not on file    Forced sexual activity: Not on file  Other Topics Concern  . Not on file  Social History Narrative  . Not on file   Family History  Problem Relation Age of Onset  . Obesity Mother   . Melanoma Father   . Cancer Brother        stomach  . Obesity Sister   . Obesity Sister   . Obesity Sister   . Healthy Son   . Healthy Son   . Healthy Son   . Healthy Son  Objective: Office vital signs reviewed. BP 138/73   Pulse (!) 104   Temp (!) 100.9 F (38.3 C) (Oral)   Ht 5\' 11"  (1.803 m)   Wt 165 lb (74.8 kg)   SpO2 98%   BMI 23.01 kg/m   Physical Examination:  General: Awake, alert, ill appearing, No acute distress HEENT: Normal    Neck: No masses palpated. No lymphadenopathy    Ears: Tympanic membranes intact, normal light reflex, no erythema, no bulging    Eyes: extraocular membranes intact, sclera white    Nose: nasal turbinates moist, clear nasal discharge    Throat: mucus membranes slightly tacky, no erythema, no tonsillar exudate.  Airway is patent Cardio: regular rate and rhythm, S1S2 heard, no murmurs appreciated Pulm: clear to auscultation bilaterally, no wheezes, rhonchi or rales; normal work of breathing on room air  Blood pressure 137/72, pulse 99, temperature 100.1 F (37.8 C), temperature source Oral, height 5\' 11"  (1.803 m), weight 165 lb (74.8 kg), SpO2 98 %.  Assessment/ Plan: 73 y.o. male   1. Influenza Spencer Patient is febrile to 100.9  F here in office.  He has associated tachycardia.  Vital signs are otherwise within normal limits.  He was tested for influenza which was positive for influenza Spencer.  Coronavirus was considered amongst differential diagnosis given recent travel to the [place].  However, the health department was contacted and noted that he was cleared from their standpoint and he was not considered to have been in contact at all with any persons containing coronavirus on the ship.  He was given Spencer dose of 500 mL via IV infusion.  He was also given 400 mg of ibuprofen here in office.  Both the tachycardia and temperature improved after administration of these therapies.  He was sent home with Tamiflu 75 mg p.o. twice daily.  Home care instructions reviewed with wife and Spencer handout was provided.  Reasons return and emergent evaluation emergency department discussed. - Veritor Flu Spencer/B Waived  2. Dehydration 500 cc bolus administered during today's visit  3. Tachycardia Improved upon recheck though still slightly elevated heart rate.  I think this is secondary to continued fever but appears to be improving.   Orders Placed This Encounter  Procedures  . Veritor Flu Spencer/B Waived    Order Specific Question:   Source    Answer:   nasal   Meds ordered this encounter  Medications  . oseltamivir (TAMIFLU) 75 MG capsule    Sig: Take 1 capsule (75 mg total) by mouth 2 (two) times daily for 5 days.    Dispense:  10 capsule    Refill:  0  . benzonatate (TESSALON PERLES) 100 MG capsule    Sig: Take 1 capsule (100 mg total) by mouth 3 (three) times daily as needed.    Dispense:  20 capsule    Refill:  [place], DO [place] [phone]

## 2018-10-25 ENCOUNTER — Encounter: Payer: Self-pay | Admitting: Nurse Practitioner

## 2018-10-25 ENCOUNTER — Ambulatory Visit (INDEPENDENT_AMBULATORY_CARE_PROVIDER_SITE_OTHER): Payer: PPO

## 2018-10-25 ENCOUNTER — Ambulatory Visit (INDEPENDENT_AMBULATORY_CARE_PROVIDER_SITE_OTHER): Payer: PPO | Admitting: Nurse Practitioner

## 2018-10-25 VITALS — BP 105/59 | HR 81 | Temp 97.9°F | Ht 71.0 in | Wt 161.0 lb

## 2018-10-25 DIAGNOSIS — J069 Acute upper respiratory infection, unspecified: Secondary | ICD-10-CM | POA: Diagnosis not present

## 2018-10-25 DIAGNOSIS — R05 Cough: Secondary | ICD-10-CM | POA: Diagnosis not present

## 2018-10-25 DIAGNOSIS — R059 Cough, unspecified: Secondary | ICD-10-CM

## 2018-10-25 MED ORDER — HYDROCODONE-HOMATROPINE 5-1.5 MG/5ML PO SYRP: 5.0000 mL | ORAL_SOLUTION | Freq: Four times a day (QID) | ORAL | 0 refills | Status: DC | PRN

## 2018-10-25 MED ORDER — METHYLPREDNISOLONE ACETATE 80 MG/ML IJ SUSP
80.0000 mg | Freq: Once | INTRAMUSCULAR | Status: AC
  Administered 2018-10-25: 80 mg via INTRAMUSCULAR

## 2018-10-25 MED ORDER — AZITHROMYCIN 250 MG PO TABS: ORAL_TABLET | ORAL | 0 refills | Status: DC

## 2018-10-25 NOTE — Progress Notes (Signed)
Subjective:    Patient ID: Spencer Lose., male    DOB: 1946/01/19, 73 y.o.   MRN: [id number]   Chief Complaint: Fatigue (Finsihed Tamiflu Sunday) and Cough   HPI Patient was seen last week and was dx with flu. He finished his tamiflu on Sunday. Now he is c/o fatigue and cough no better. He has been taking tessalon perles which have not helped   Review of Systems  Constitutional: Negative for chills and fever.  HENT: Positive for congestion and rhinorrhea.   Respiratory: Positive for cough. Negative for shortness of breath.   Neurological: Positive for headaches.  Psychiatric/Behavioral: Negative.   All other systems reviewed and are negative.      Objective:   Physical Exam Constitutional:      Appearance: He is normal weight.  HENT:     Right Ear: Hearing, tympanic membrane, ear canal and external ear normal.     Left Ear: Hearing, tympanic membrane, ear canal and external ear normal.     Nose: Nose normal.     Right Sinus: No maxillary sinus tenderness or frontal sinus tenderness.     Left Sinus: No maxillary sinus tenderness or frontal sinus tenderness.     Mouth/Throat:     Lips: Pink.     Pharynx: Oropharynx is clear. Uvula midline.  Neck:     Musculoskeletal: Normal range of motion.  Cardiovascular:     Rate and Rhythm: Normal rate and regular rhythm.     Pulses: Normal pulses.     Heart sounds: Normal heart sounds.  Pulmonary:     Effort: Pulmonary effort is normal.     Breath sounds: No wheezing or rhonchi.     Comments: Diminished breath sound in bil bases Skin:    General: Skin is warm and dry.  Neurological:     General: No focal deficit present.     Mental Status: He is alert and oriented to person, place, and time.  Psychiatric:        Mood and Affect: Mood normal.        Behavior: Behavior normal.    BP (!) 105/59   Pulse 81   Temp 97.9 F (36.6 C) (Oral)   Ht 5\' 11"  (1.803 m)   Wt 161 lb (73 kg)   BMI 22.45 kg/m   Chest x ray-  unchanged from previous-Preliminary reading by Ronnald Collum, FNP  [facility]     Assessment & Plan:  Spencer Lose. in today with chief complaint of Fatigue (Finsihed Tamiflu Sunday) and Cough   1. Cough - DG Chest 2 View; Future  2. Upper respiratory infection with cough and congestion 1. Take meds as prescribed 2. Use a cool mist humidifier especially during the winter months and when heat has been humid. 3. Use saline nose sprays frequently 4. Saline irrigations of the nose can be very helpful if done frequently.  * 4X daily for 1 week*  * Use of a nettie pot can be helpful with this. Follow directions with this* 5. Drink plenty of fluids 6. Keep thermostat turn down low 7.For any cough or congestion  Use plain Mucinex- regular strength or max strength is fine   * Children- consult with Pharmacist for dosing 8. For fever or aces or pains- take tylenol or ibuprofen appropriate for age and weight.  * for fevers greater than 101 orally you may alternate ibuprofen and tylenol every  3 hours.    - methylPREDNISolone acetate (DEPO-MEDROL) injection  80 mg - HYDROcodone-homatropine (HYCODAN) 5-1.5 MG/5ML syrup; Take 5 mLs by mouth every 6 (six) hours as needed for cough.  Dispense: 120 mL; Refill: 0 - azithromycin (ZITHROMAX Z-PAK) 250 MG tablet; As directed  Dispense: 6 tablet; Refill: 0  Mary-Margaret Hassell Done, FNP

## 2018-10-25 NOTE — Patient Instructions (Signed)

## 2018-11-02 ENCOUNTER — Encounter: Payer: Self-pay | Admitting: Family Medicine

## 2018-11-02 ENCOUNTER — Ambulatory Visit (INDEPENDENT_AMBULATORY_CARE_PROVIDER_SITE_OTHER): Payer: PPO

## 2018-11-02 ENCOUNTER — Other Ambulatory Visit: Payer: Self-pay

## 2018-11-02 ENCOUNTER — Ambulatory Visit (INDEPENDENT_AMBULATORY_CARE_PROVIDER_SITE_OTHER): Payer: PPO | Admitting: Family Medicine

## 2018-11-02 VITALS — BP 103/72 | HR 107 | Temp 97.7°F | Ht 71.0 in | Wt 156.2 lb

## 2018-11-02 DIAGNOSIS — R059 Cough, unspecified: Secondary | ICD-10-CM

## 2018-11-02 DIAGNOSIS — R05 Cough: Secondary | ICD-10-CM | POA: Diagnosis not present

## 2018-11-02 DIAGNOSIS — J441 Chronic obstructive pulmonary disease with (acute) exacerbation: Secondary | ICD-10-CM | POA: Diagnosis not present

## 2018-11-02 LAB — CBC WITH DIFFERENTIAL/PLATELET
BASOS ABS: 0 10*3/uL (ref 0.0–0.2)
Basos: 0 %
EOS (ABSOLUTE): 0.1 10*3/uL (ref 0.0–0.4)
Eos: 1 %
HEMATOCRIT: 35.5 % — AB (ref 37.5–51.0)
HEMOGLOBIN: 12.5 g/dL — AB (ref 13.0–17.7)
IMMATURE GRANS (ABS): 0.1 10*3/uL (ref 0.0–0.1)
Immature Granulocytes: 1 %
LYMPHS: 28 %
Lymphocytes Absolute: 3.3 10*3/uL — ABNORMAL HIGH (ref 0.7–3.1)
MCH: 31.5 pg (ref 26.6–33.0)
MCHC: 35.2 g/dL (ref 31.5–35.7)
MCV: 89 fL (ref 79–97)
Monocytes Absolute: 0.8 10*3/uL (ref 0.1–0.9)
Monocytes: 7 %
NEUTROS ABS: 7.3 10*3/uL — AB (ref 1.4–7.0)
NEUTROS PCT: 63 %
Platelets: 502 10*3/uL — ABNORMAL HIGH (ref 150–450)
RBC: 3.97 x10E6/uL — ABNORMAL LOW (ref 4.14–5.80)
RDW: 12.1 % (ref 11.6–15.4)
WBC: 11.6 10*3/uL — AB (ref 3.4–10.8)

## 2018-11-02 LAB — CMP14+EGFR
ALBUMIN: 3.4 g/dL — AB (ref 3.7–4.7)
ALK PHOS: 65 IU/L (ref 39–117)
ALT: 15 IU/L (ref 0–44)
AST: 16 IU/L (ref 0–40)
Albumin/Globulin Ratio: 1.2 (ref 1.2–2.2)
BUN/Creatinine Ratio: 10 (ref 10–24)
BUN: 8 mg/dL (ref 8–27)
Bilirubin Total: 0.4 mg/dL (ref 0.0–1.2)
CO2: 23 mmol/L (ref 20–29)
Calcium: 9 mg/dL (ref 8.6–10.2)
Chloride: 97 mmol/L (ref 96–106)
Creatinine, Ser: 0.77 mg/dL (ref 0.76–1.27)
GFR calc Af Amer: 106 mL/min/{1.73_m2} (ref >59)
GFR calc non Af Amer: 91 mL/min/{1.73_m2} (ref >59)
Globulin, Total: 2.9 g/dL (ref 1.5–4.5)
Glucose: 80 mg/dL (ref 65–99)
Potassium: 4.3 mmol/L (ref 3.5–5.2)
Sodium: 133 mmol/L — ABNORMAL LOW (ref 134–144)
Total Protein: 6.3 g/dL (ref 6.0–8.5)

## 2018-11-02 MED ORDER — LEVOFLOXACIN 500 MG PO TABS: 500.0000 mg | ORAL_TABLET | Freq: Every day | ORAL | 0 refills | Status: DC

## 2018-11-02 MED ORDER — BETAMETHASONE SOD PHOS & ACET 6 (3-3) MG/ML IJ SUSP
6.0000 mg | Freq: Once | INTRAMUSCULAR | Status: AC
  Administered 2018-11-02: 6 mg via INTRAMUSCULAR

## 2018-11-02 MED ORDER — HYDROCODONE-HOMATROPINE 5-1.5 MG/5ML PO SYRP: 5.0000 mL | ORAL_SOLUTION | Freq: Four times a day (QID) | ORAL | 0 refills | Status: AC | PRN

## 2018-11-02 NOTE — Progress Notes (Signed)
Subjective:  Patient ID: Spencer Lose., male    DOB: Jul 03, 1946  Age: 73 y.o. MRN: [id number]  CC: Cough (pt here today c/o cough and congestion)   HPI Spencer Lose. presents for continued cough in spite of his previous treatment for flu and bronchitis.  Patient reports that he became ill approximately February 11.  He presented to this office and was diagnosed with flu and given Tamiflu.  A few days later he was not better he came back diagnosed with bronchitis and given a Z-Pak and Tessalon Perles.  Unfortunately his cough continues to persist.  It has become productive over the last few days of thick greenish-yellow sputum.  Although he denies shortness of breath.,  There is some mental status change.  He could not remember how to get into his computer yesterday he did not even try this morning.  Additionally, he tried to get himself dressed yesterday and could not figure out how to put on his own underwear.  He did dress himself this morning.  He has no focal neurologic finding or change noted by he or his wife here today with him.  Depression screen [facility] 2/9 11/02/2018 10/25/2018 10/19/2018  Decreased Interest 0 0 0  Down, Depressed, Hopeless 0 0 0  PHQ - 2 Score 0 0 0  Altered sleeping - - 0  Tired, decreased energy - - 0  Change in appetite - - 0  Feeling bad or failure about yourself  - - 0  Trouble concentrating - - 0  Moving slowly or fidgety/restless - - 0  Suicidal thoughts - - 0  PHQ-9 Score - - 0    History Spencer Miller has a past medical history of Cataract.   He has a past surgical history that includes Fracture surgery; Hernia repair; and Cataract extraction, bilateral.   His family history includes Cancer in his brother; Healthy in his son, son, son, and son; Melanoma in his father; Obesity in his mother, sister, sister, and sister.He reports that he has been smoking cigarettes. He has a 40.00 pack-year smoking history. He has never used smokeless tobacco. He reports  current alcohol use of about 14.0 standard drinks of alcohol per week. He reports that he does not use drugs.    ROS Review of Systems  Constitutional: Positive for activity change, appetite change and fatigue. Negative for chills.  HENT: Positive for congestion, postnasal drip and sinus pressure. Negative for ear discharge, ear pain, hearing loss, nosebleeds, sneezing and trouble swallowing.   Respiratory: Positive for cough. Negative for chest tightness and shortness of breath.   Cardiovascular: Negative for chest pain and palpitations.  Skin: Negative for rash.    Objective:  BP 103/72   Pulse (!) 107   Temp 97.7 F (36.5 C) (Oral)   Ht 5' 11"  (1.803 m)   Wt 156 lb 4 oz (70.9 kg)   BMI 21.79 kg/m   BP Readings from Last 3 Encounters:  11/02/18 103/72  10/25/18 (!) 105/59  10/19/18 137/72    Wt Readings from Last 3 Encounters:  11/02/18 156 lb 4 oz (70.9 kg)  10/25/18 161 lb (73 kg)  10/19/18 165 lb (74.8 kg)     Physical Exam Constitutional:      Appearance: He is well-developed.  HENT:     Head: Normocephalic and atraumatic.     Right Ear: Tympanic membrane and external ear normal. No decreased hearing noted.     Left Ear: Tympanic membrane and external ear  normal. No decreased hearing noted.     Nose: Mucosal edema present.     Right Sinus: No frontal sinus tenderness.     Left Sinus: No frontal sinus tenderness.     Mouth/Throat:     Pharynx: No oropharyngeal exudate or posterior oropharyngeal erythema.  Neck:     Meningeal: Brudzinski's sign absent.  Pulmonary:     Effort: No respiratory distress.     Breath sounds: Normal breath sounds. No rhonchi or rales.     Comments: Deep cough with copious green sputum noted during exam. Lymphadenopathy:     Head:     Right side of head: No preauricular adenopathy.     Left side of head: No preauricular adenopathy.     Cervical:     Right cervical: No superficial cervical adenopathy.    Left cervical: No  superficial cervical adenopathy.       Assessment & Plan:   Spencer Miller was seen today for cough.  Diagnoses and all orders for this visit:  Cough -     CBC with Differential/Platelet -     CMP14+EGFR -     DG Chest 2 View; Future -     betamethasone acetate-betamethasone sodium phosphate (CELESTONE) injection 6 mg  Acute exacerbation of chronic obstructive pulmonary disease (COPD) ([place])  Other orders -     levofloxacin (LEVAQUIN) 500 MG tablet; Take 1 tablet (500 mg total) by mouth daily. For 10 days -     HYDROcodone-homatropine (HYCODAN) 5-1.5 MG/5ML syrup; Take 5 mLs by mouth every 6 (six) hours as needed for up to 5 days for cough.       I have discontinued Spencer Alken Jr.'s benzonatate, HYDROcodone-homatropine, and azithromycin. I am also having him start on levofloxacin and HYDROcodone-homatropine. We will continue to administer betamethasone acetate-betamethasone sodium phosphate.  Allergies as of 11/02/2018   No Known Allergies     Medication List       Accurate as of November 02, 2018 10:14 AM. Always use your most recent med list.        HYDROcodone-homatropine 5-1.5 MG/5ML syrup Commonly known as:  HYCODAN Take 5 mLs by mouth every 6 (six) hours as needed for up to 5 days for cough.   levofloxacin 500 MG tablet Commonly known as:  LEVAQUIN Take 1 tablet (500 mg total) by mouth daily. For 10 days        Follow-up: Return in about 2 weeks (around 11/16/2018), or if symptoms worsen or fail to improve, for COPD Dettinger.  Claretta Fraise, M.D.

## 2019-01-10 ENCOUNTER — Ambulatory Visit (INDEPENDENT_AMBULATORY_CARE_PROVIDER_SITE_OTHER): Payer: PPO | Admitting: Family Medicine

## 2019-01-10 ENCOUNTER — Encounter: Payer: Self-pay | Admitting: Family Medicine

## 2019-01-10 ENCOUNTER — Other Ambulatory Visit: Payer: Self-pay

## 2019-01-10 DIAGNOSIS — D649 Anemia, unspecified: Secondary | ICD-10-CM | POA: Diagnosis not present

## 2019-01-10 DIAGNOSIS — J410 Simple chronic bronchitis: Secondary | ICD-10-CM | POA: Diagnosis not present

## 2019-01-10 DIAGNOSIS — F17210 Nicotine dependence, cigarettes, uncomplicated: Secondary | ICD-10-CM | POA: Diagnosis not present

## 2019-01-10 DIAGNOSIS — Z1211 Encounter for screening for malignant neoplasm of colon: Secondary | ICD-10-CM

## 2019-01-10 MED ORDER — PREDNISONE 10 MG PO TABS: ORAL_TABLET | ORAL | 0 refills | Status: DC

## 2019-01-10 NOTE — Progress Notes (Signed)
Subjective:    Patient ID: Spencer Lose., male    DOB: 29-May-1946, 73 y.o.   MRN: [id number]   HPI: Spencer Miller. is a 73 y.o. male presenting for AM cough with phlegm. Cough subsides, but feels fatigue. Feels tired and weak. Concerned that he has lost weight. Weight at home this AM is 155. Appetite is fair. Sleep is okay. Wife requesting thyroid testing. Pt. Went through respiratory illness recently (chart reviewed) that took quite a toll on him. Although his symptoms have resolved, he is concerned that it was COVID and wonders if his weight loss and energy loss were the cause of his current listlessness. Denies depression, sadness. No chest pain or edema. Had CT low dose that was negative last summer. He continues to smoke.   Depression screen [facility] 2/9 11/02/2018 10/25/2018 10/19/2018 07/27/2018 02/21/2018  Decreased Interest 0 0 0 0 0  Down, Depressed, Hopeless 0 0 0 0 0  PHQ - 2 Score 0 0 0 0 0  Altered sleeping - - 0 - -  Tired, decreased energy - - 0 - -  Change in appetite - - 0 - -  Feeling bad or failure about yourself  - - 0 - -  Trouble concentrating - - 0 - -  Moving slowly or fidgety/restless - - 0 - -  Suicidal thoughts - - 0 - -  PHQ-9 Score - - 0 - -     Relevant past medical, surgical, family and social history reviewed and updated as indicated.  Interim medical history since our last visit reviewed. Allergies and medications reviewed and updated.  ROS:  Review of Systems  Constitutional: Positive for fatigue.  HENT: Negative.   Eyes: Negative for visual disturbance.  Respiratory: Positive for cough. Negative for shortness of breath.   Cardiovascular: Negative for chest pain and leg swelling.  Gastrointestinal: Negative for abdominal pain, diarrhea, nausea and vomiting.  Genitourinary: Negative for difficulty urinating.  Musculoskeletal: Negative for arthralgias and myalgias.  Skin: Negative for rash.  Neurological: Positive for weakness  (generalized, nonfocal). Negative for headaches.  Psychiatric/Behavioral: Negative for sleep disturbance.     Social History   Tobacco Use  Smoking Status Current Every Day Smoker  . Packs/day: 1.00  . Years: 40.00  . Pack years: 40.00  . Types: Cigarettes  Smokeless Tobacco Never Used       Objective:     Wt Readings from Last 3 Encounters:  11/02/18 156 lb 4 oz (70.9 kg)  10/25/18 161 lb (73 kg)  10/19/18 165 lb (74.8 kg)     Exam deferred. Pt. Harboring due to COVID 19. Phone visit performed.   Assessment & Plan:   1. Fatigue associated with anemia   2. Simple chronic bronchitis ([place])     Meds ordered this encounter  Medications  . DISCONTD: predniSONE (DELTASONE) 10 MG tablet    Sig: Take 5 daily for 2 days followed by 4,3,2 and 1 for 2 days each.    Dispense:  30 tablet    Refill:  0  . predniSONE (DELTASONE) 10 MG tablet    Sig: Take 5 daily for 2 days followed by 4,3,2 and 1 for 2 days each.    Dispense:  30 tablet    Refill:  0    Orders Placed This Encounter  Procedures  . CBC with Differential/Platelet  . CMP14+EGFR  . TSH      Diagnoses and all orders for this visit:  Fatigue  associated with anemia -     CBC with Differential/Platelet -     CMP14+EGFR -     TSH  Simple chronic bronchitis (HCC) -     CBC with Differential/Platelet -     CMP14+EGFR -     TSH  Other orders -     Discontinue: predniSONE (DELTASONE) 10 MG tablet; Take 5 daily for 2 days followed by 4,3,2 and 1 for 2 days each. -     predniSONE (DELTASONE) 10 MG tablet; Take 5 daily for 2 days followed by 4,3,2 and 1 for 2 days each.    Virtual Visit via telephone Note  I discussed the limitations, risks, security and privacy concerns of performing an evaluation and management service by telephone and the availability of in person appointments. The patient was identified with two identifiers. Pt.expressed understanding and agreed to proceed. Pt. Is at home. Dr. Livia Snellen is  in his office.  Follow Up Instructions:   I discussed the assessment and treatment plan with the patient. The patient was provided an opportunity to ask questions and all were answered. The patient agreed with the plan and demonstrated an understanding of the instructions.   The patient was advised to call back or seek an in-person evaluation if the symptoms worsen or if the condition fails to improve as anticipated.  Visit started: 7:55 Call ended:  8:20 Total minutes including chart review and phone contact time: 30   Follow up plan: Return in about 2 weeks (around 01/24/2019).  Spencer Fraise, MD [facility]

## 2019-01-13 ENCOUNTER — Other Ambulatory Visit: Payer: PPO

## 2019-01-13 ENCOUNTER — Other Ambulatory Visit: Payer: Self-pay

## 2019-01-13 DIAGNOSIS — D649 Anemia, unspecified: Secondary | ICD-10-CM | POA: Diagnosis not present

## 2019-01-13 DIAGNOSIS — J410 Simple chronic bronchitis: Secondary | ICD-10-CM | POA: Diagnosis not present

## 2019-01-14 LAB — CMP14+EGFR
ALT: 13 IU/L (ref 0–44)
AST: 14 IU/L (ref 0–40)
Albumin/Globulin Ratio: 1.9 (ref 1.2–2.2)
Albumin: 4.2 g/dL (ref 3.7–4.7)
Alkaline Phosphatase: 70 IU/L (ref 39–117)
BUN/Creatinine Ratio: 13 (ref 10–24)
BUN: 11 mg/dL (ref 8–27)
Bilirubin Total: 0.4 mg/dL (ref 0.0–1.2)
CO2: 24 mmol/L (ref 20–29)
Calcium: 9.1 mg/dL (ref 8.6–10.2)
Chloride: 101 mmol/L (ref 96–106)
Creatinine, Ser: 0.85 mg/dL (ref 0.76–1.27)
GFR calc Af Amer: 101 mL/min/{1.73_m2} (ref >59)
GFR calc non Af Amer: 88 mL/min/{1.73_m2} (ref >59)
Globulin, Total: 2.2 g/dL (ref 1.5–4.5)
Glucose: 89 mg/dL (ref 65–99)
Potassium: 4.5 mmol/L (ref 3.5–5.2)
Sodium: 138 mmol/L (ref 134–144)
Total Protein: 6.4 g/dL (ref 6.0–8.5)

## 2019-01-14 LAB — CBC WITH DIFFERENTIAL/PLATELET
Basophils Absolute: 0.1 10*3/uL (ref 0.0–0.2)
Basos: 1 %
EOS (ABSOLUTE): 0.2 10*3/uL (ref 0.0–0.4)
Eos: 2 %
Hematocrit: 39.4 % (ref 37.5–51.0)
Hemoglobin: 13.8 g/dL (ref 13.0–17.7)
Immature Grans (Abs): 0 10*3/uL (ref 0.0–0.1)
Immature Granulocytes: 0 %
Lymphocytes Absolute: 2.7 10*3/uL (ref 0.7–3.1)
Lymphs: 34 %
MCH: 33.2 pg — ABNORMAL HIGH (ref 26.6–33.0)
MCHC: 35 g/dL (ref 31.5–35.7)
MCV: 95 fL (ref 79–97)
Monocytes Absolute: 0.5 10*3/uL (ref 0.1–0.9)
Monocytes: 7 %
Neutrophils Absolute: 4.5 10*3/uL (ref 1.4–7.0)
Neutrophils: 56 %
Platelets: 252 10*3/uL (ref 150–450)
RBC: 4.16 x10E6/uL (ref 4.14–5.80)
RDW: 13.5 % (ref 11.6–15.4)
WBC: 8 10*3/uL (ref 3.4–10.8)

## 2019-01-14 LAB — TSH: TSH: 1.1 u[IU]/mL (ref 0.450–4.500)

## 2019-01-18 ENCOUNTER — Telehealth: Payer: Self-pay | Admitting: Family Medicine

## 2019-01-18 NOTE — Telephone Encounter (Signed)
Please contact the patient His labs are normal. Cologard isn't due until July, but I will put it in so they will go ahead and send him the kit.

## 2019-01-18 NOTE — Addendum Note (Signed)
Addended by: Claretta Fraise on: 01/18/2019 03:03 PM   Modules accepted: Orders

## 2019-01-18 NOTE — Telephone Encounter (Signed)
Please send notes to pool on reviewed lab results. Thanks.  Cologuard ordered?

## 2019-01-23 ENCOUNTER — Encounter: Payer: Self-pay | Admitting: Family

## 2019-01-23 ENCOUNTER — Ambulatory Visit (INDEPENDENT_AMBULATORY_CARE_PROVIDER_SITE_OTHER): Payer: PPO | Admitting: Family

## 2019-01-23 ENCOUNTER — Other Ambulatory Visit: Payer: Self-pay

## 2019-01-23 DIAGNOSIS — W57XXXA Bitten or stung by nonvenomous insect and other nonvenomous arthropods, initial encounter: Secondary | ICD-10-CM

## 2019-01-23 DIAGNOSIS — S80862A Insect bite (nonvenomous), left lower leg, initial encounter: Secondary | ICD-10-CM | POA: Diagnosis not present

## 2019-01-23 MED ORDER — DOXYCYCLINE HYCLATE 100 MG PO TABS: 100.0000 mg | ORAL_TABLET | Freq: Two times a day (BID) | ORAL | 0 refills | Status: DC

## 2019-01-23 NOTE — Telephone Encounter (Signed)
Patient can see lab results in My Chart but the provider did not write any notes with details concerning them.   Please review and give details for patient .

## 2019-01-23 NOTE — Telephone Encounter (Signed)
Wife aware of labs and cologaurd order

## 2019-01-23 NOTE — Telephone Encounter (Signed)
Please contact the patient  Re lab, Everything looked great - normal!

## 2019-01-23 NOTE — Progress Notes (Signed)
   Virtual Visit via telephone Note  I connected with Spencer Miller. on 01/23/19 at 11:05 AM by telephone and verified that I am speaking with the correct person using two identifiers. Spencer Miller. is currently located at home and wife is currently with her during visit. The provider, Evelina Dun, FNP is located in their office at time of visit.  I discussed the limitations, risks, security and privacy concerns of performing an evaluation and management service by telephone and the availability of in person appointments. I also discussed with the patient that there may be a patient responsible charge related to this service. The patient expressed understanding and agreed to proceed.   History and Present Illness:  HPI Pt calls today with a tick bite that he noticed yesterday left calf. He states the tick was attached less than 24 hours. States the area is itchy, slight swelling, and draining slight pus. States the redness is about a 2 in diameter.   Denies rash, fever, joint pain, or headaches.    Review of Systems  Skin: Positive for rash.  All other systems reviewed and are negative.    Observations/Objective: No SOB or distress  Assessment and Plan: 1. Tick bite of calf, left, initial encounter -Pt to report any new fever, joint pain, or rash -Wear protective clothing while outside- Long sleeves and long pants -Put insect repellent on all exposed skin and along clothing -Take a shower as soon as possible after being outside RTO if symptoms worsen or do not improve - doxycycline (VIBRA-TABS) 100 MG tablet; Take 1 tablet (100 mg total) by mouth 2 (two) times daily.  Dispense: 20 tablet; Refill: 0     I discussed the assessment and treatment plan with the patient. The patient was provided an opportunity to ask questions and all were answered. The patient agreed with the plan and demonstrated an understanding of the instructions.   The patient was advised to call back  or seek an in-person evaluation if the symptoms worsen or if the condition fails to improve as anticipated.  The above assessment and management plan was discussed with the patient. The patient verbalized understanding of and has agreed to the management plan. Patient is aware to call the clinic if symptoms persist or worsen. Patient is aware when to return to the clinic for a follow-up visit. Patient educated on when it is appropriate to go to the emergency department.   Time call ended:  11:16 AM  I provided 11 minutes of non-face-to-face time during this encounter.    Evelina Dun, FNP

## 2019-02-13 DIAGNOSIS — L820 Inflamed seborrheic keratosis: Secondary | ICD-10-CM | POA: Diagnosis not present

## 2019-02-13 DIAGNOSIS — D485 Neoplasm of uncertain behavior of skin: Secondary | ICD-10-CM | POA: Diagnosis not present

## 2019-02-13 DIAGNOSIS — L821 Other seborrheic keratosis: Secondary | ICD-10-CM | POA: Diagnosis not present

## 2019-02-13 DIAGNOSIS — D229 Melanocytic nevi, unspecified: Secondary | ICD-10-CM | POA: Diagnosis not present

## 2019-03-10 ENCOUNTER — Other Ambulatory Visit: Payer: Self-pay

## 2019-03-13 ENCOUNTER — Encounter: Payer: Self-pay | Admitting: Family Medicine

## 2019-03-13 ENCOUNTER — Other Ambulatory Visit: Payer: Self-pay

## 2019-03-13 ENCOUNTER — Ambulatory Visit (INDEPENDENT_AMBULATORY_CARE_PROVIDER_SITE_OTHER): Payer: PPO | Admitting: Family Medicine

## 2019-03-13 VITALS — BP 128/81 | HR 80 | Temp 97.2°F | Ht 71.0 in | Wt 155.4 lb

## 2019-03-13 DIAGNOSIS — J439 Emphysema, unspecified: Secondary | ICD-10-CM | POA: Diagnosis not present

## 2019-03-13 DIAGNOSIS — Z1211 Encounter for screening for malignant neoplasm of colon: Secondary | ICD-10-CM | POA: Diagnosis not present

## 2019-03-13 DIAGNOSIS — R918 Other nonspecific abnormal finding of lung field: Secondary | ICD-10-CM | POA: Diagnosis not present

## 2019-03-13 DIAGNOSIS — I700 Atherosclerosis of aorta: Secondary | ICD-10-CM

## 2019-03-13 DIAGNOSIS — N401 Enlarged prostate with lower urinary tract symptoms: Secondary | ICD-10-CM | POA: Diagnosis not present

## 2019-03-13 DIAGNOSIS — N3943 Post-void dribbling: Secondary | ICD-10-CM | POA: Diagnosis not present

## 2019-03-13 DIAGNOSIS — Z1159 Encounter for screening for other viral diseases: Secondary | ICD-10-CM

## 2019-03-13 DIAGNOSIS — F17210 Nicotine dependence, cigarettes, uncomplicated: Secondary | ICD-10-CM

## 2019-03-13 DIAGNOSIS — Z0000 Encounter for general adult medical examination without abnormal findings: Secondary | ICD-10-CM

## 2019-03-13 DIAGNOSIS — Z0001 Encounter for general adult medical examination with abnormal findings: Secondary | ICD-10-CM | POA: Diagnosis not present

## 2019-03-13 NOTE — Progress Notes (Signed)
BP 128/81   Pulse 80   Temp (!) 97.2 F (36.2 C) (Oral)   Ht _0  (1.803 m)   Wt 155 lb 6.4 oz (70.5 kg)   BMI 21.67 kg/m    Subjective:   Patient ID: Spencer Lose., male    DOB: 1945-09-27, 73 y.o.   MRN: [id number]  HPI: Spencer Attwood. is a 73 y.o. male presenting on 03/13/2019 for hep c blood test, Prostate Check, and cologuard   HPI Adult well exam and recheck of chronic issues. Patient is coming in today for adult well exam and recheck of chronic issues.  He is coming in to recheck for colon cancer screening and he also needs a repeat CT scan of his chest because of history of nodules.  He was most recently scanned 1 year ago.  Patient is still currently smoking more than 1 pack/day for more than 40 years now.  COPD Patient is coming in for COPD recheck today.  He is currently on no medications.  He has a mild chronic cough but denies any major coughing spells or wheezing spells.  He has 1nighttime symptoms per week and 1daytime symptoms per week currently.  He is feeling a lot better, during the month of February he had a lot of issues.  He has the occasional cough and some fatigue but otherwise feels like he is mostly recovered  Patient has history of BPH and has some urinary frequency but otherwise denies any symptoms currently.  Would like to have his prostate checked and blood work today.  Patient has aortic atherosclerosis we discussed statin but he declined.  Patient recently had blood work from the [place] and all of his numbers look really good including cholesterol A1c CBC and chemistry panel.  Relevant past medical, surgical, family and social history reviewed and updated as indicated. Interim medical history since our last visit reviewed. Allergies and medications reviewed and updated.  Review of Systems  Constitutional: Negative for chills and fever.  HENT: Negative for ear pain and tinnitus.   Eyes: Negative for pain and discharge.  Respiratory: Negative  for cough, shortness of breath and wheezing.   Cardiovascular: Negative for chest pain, palpitations and leg swelling.  Gastrointestinal: Negative for abdominal pain, blood in stool, constipation and diarrhea.  Genitourinary: Positive for frequency. Negative for difficulty urinating, dysuria, hematuria and urgency.  Musculoskeletal: Negative for back pain, gait problem and myalgias.  Skin: Negative for rash.  Neurological: Negative for dizziness, weakness and headaches.  Psychiatric/Behavioral: Negative for suicidal ideas.  All other systems reviewed and are negative.   Per HPI unless specifically indicated above   Allergies as of 03/13/2019   No Known Allergies     Medication List       Accurate as of March 13, 2019  8:54 AM. If you have any questions, ask your nurse or doctor.        STOP taking these medications   doxycycline 100 MG tablet Commonly known as: VIBRA-TABS Stopped by: Fransisca Kaufmann Mildreth Reek, MD        Objective:   BP 128/81   Pulse 80   Temp (!) 97.2 F (36.2 C) (Oral)   Ht _1  (1.803 m)   Wt 155 lb 6.4 oz (70.5 kg)   BMI 21.67 kg/m   Wt Readings from Last 3 Encounters:  03/13/19 155 lb 6.4 oz (70.5 kg)  11/02/18 156 lb 4 oz (70.9 kg)  10/25/18 161 lb (73 kg)  Physical Exam Vitals signs and nursing note reviewed.  Constitutional:      General: He is not in acute distress.    Appearance: He is well-developed. He is not diaphoretic.  Eyes:     General: No scleral icterus.    Conjunctiva/sclera: Conjunctivae normal.  Neck:     Musculoskeletal: Neck supple.     Thyroid: No thyromegaly.  Cardiovascular:     Rate and Rhythm: Normal rate and regular rhythm.     Heart sounds: Normal heart sounds. No murmur.  Pulmonary:     Effort: Pulmonary effort is normal. No respiratory distress.     Breath sounds: Normal breath sounds. No wheezing.  Musculoskeletal: Normal range of motion.  Lymphadenopathy:     Cervical: No cervical adenopathy.  Skin:     General: Skin is warm and dry.     Findings: No rash.  Neurological:     Mental Status: He is alert and oriented to person, place, and time.     Coordination: Coordination normal.  Psychiatric:        Behavior: Behavior normal.       Assessment & Plan:   Problem List Items Addressed This Visit      Cardiovascular and Mediastinum   Aortic atherosclerosis ([place])   Relevant Orders   CMP14+EGFR     Respiratory   Emphysema of lung ([place])   Relevant Orders   CMP14+EGFR     Genitourinary   BPH (benign prostatic hyperplasia)   Relevant Orders   CMP14+EGFR   PSA, total and free     Other   Pulmonary nodules   Smokes less than 1 pack a day with greater than 40 pack year history    Other Visit Diagnoses    Well adult exam    -  Primary   Abnormal findings on diagnostic imaging of lung       Relevant Orders   CT CHEST LUNG CA SCREEN LOW DOSE W/O CM   Need for hepatitis C screening test       Relevant Orders   Hepatitis C antibody   Colon cancer screening       Relevant Orders   Cologuard      Continue current course, will check PSA and chemistry panel and will order Cologuard and hepatitis C testing and CT chest for lung cancer screening Follow up plan: Return in about 1 year (around 03/12/2020), or if symptoms worsen or fail to improve.  Counseling provided for all of the vaccine components Orders Placed This Encounter  Procedures  . CT CHEST LUNG CA SCREEN LOW DOSE W/O CM  . CMP14+EGFR  . PSA, total and free  . Hepatitis C antibody  . Cologuard    Caryl Pina, MD [facility] 03/13/2019, 8:54 AM

## 2019-03-14 LAB — CMP14+EGFR
ALT: 16 IU/L (ref 0–44)
AST: 21 IU/L (ref 0–40)
Albumin/Globulin Ratio: 1.5 (ref 1.2–2.2)
Albumin: 4.1 g/dL (ref 3.7–4.7)
Alkaline Phosphatase: 75 IU/L (ref 39–117)
BUN/Creatinine Ratio: 12 (ref 10–24)
BUN: 10 mg/dL (ref 8–27)
Bilirubin Total: 0.6 mg/dL (ref 0.0–1.2)
CO2: 24 mmol/L (ref 20–29)
Calcium: 9.5 mg/dL (ref 8.6–10.2)
Chloride: 98 mmol/L (ref 96–106)
Creatinine, Ser: 0.86 mg/dL (ref 0.76–1.27)
GFR calc Af Amer: 100 mL/min/{1.73_m2} (ref >59)
GFR calc non Af Amer: 87 mL/min/{1.73_m2} (ref >59)
Globulin, Total: 2.7 g/dL (ref 1.5–4.5)
Glucose: 87 mg/dL (ref 65–99)
Potassium: 4.5 mmol/L (ref 3.5–5.2)
Sodium: 136 mmol/L (ref 134–144)
Total Protein: 6.8 g/dL (ref 6.0–8.5)

## 2019-03-14 LAB — PSA, TOTAL AND FREE
PSA, Free Pct: 44.3 %
PSA, Free: 0.31 ng/mL
Prostate Specific Ag, Serum: 0.7 ng/mL (ref 0.0–4.0)

## 2019-03-14 LAB — HEPATITIS C ANTIBODY: Hep C Virus Ab: <0.1 s/co ratio (ref 0.0–0.9)

## 2019-03-20 ENCOUNTER — Ambulatory Visit (HOSPITAL_COMMUNITY): Payer: PPO

## 2019-04-04 DIAGNOSIS — Z1211 Encounter for screening for malignant neoplasm of colon: Secondary | ICD-10-CM | POA: Diagnosis not present

## 2019-04-10 ENCOUNTER — Encounter (HOSPITAL_COMMUNITY): Payer: Self-pay

## 2019-04-10 ENCOUNTER — Ambulatory Visit (HOSPITAL_COMMUNITY): Payer: PPO

## 2019-04-20 LAB — COLOGUARD: Cologuard: NEGATIVE

## 2019-04-28 ENCOUNTER — Ambulatory Visit (HOSPITAL_COMMUNITY)
Admission: RE | Admit: 2019-04-28 | Discharge: 2019-04-28 | Disposition: A | Discharge status: Home / Self Care | Payer: PPO | Source: Ambulatory Visit | Attending: Family Medicine | Admitting: Family Medicine

## 2019-04-28 ENCOUNTER — Other Ambulatory Visit: Payer: Self-pay

## 2019-04-28 DIAGNOSIS — F17210 Nicotine dependence, cigarettes, uncomplicated: Secondary | ICD-10-CM | POA: Insufficient documentation

## 2019-04-28 DIAGNOSIS — Z122 Encounter for screening for malignant neoplasm of respiratory organs: Secondary | ICD-10-CM | POA: Insufficient documentation

## 2019-04-28 DIAGNOSIS — J439 Emphysema, unspecified: Secondary | ICD-10-CM | POA: Insufficient documentation

## 2019-04-28 DIAGNOSIS — Z87891 Personal history of nicotine dependence: Secondary | ICD-10-CM | POA: Diagnosis not present

## 2019-04-28 DIAGNOSIS — R918 Other nonspecific abnormal finding of lung field: Secondary | ICD-10-CM | POA: Insufficient documentation

## 2019-05-03 ENCOUNTER — Encounter: Payer: Self-pay | Admitting: Family

## 2019-05-03 ENCOUNTER — Other Ambulatory Visit: Payer: Self-pay

## 2019-05-03 ENCOUNTER — Ambulatory Visit (INDEPENDENT_AMBULATORY_CARE_PROVIDER_SITE_OTHER): Payer: PPO | Admitting: Family

## 2019-05-03 DIAGNOSIS — T63301A Toxic effect of unspecified spider venom, accidental (unintentional), initial encounter: Secondary | ICD-10-CM | POA: Diagnosis not present

## 2019-05-03 MED ORDER — DOXYCYCLINE HYCLATE 100 MG PO TABS: 100.0000 mg | ORAL_TABLET | Freq: Two times a day (BID) | ORAL | 0 refills | Status: DC

## 2019-05-03 NOTE — Progress Notes (Signed)
   Virtual Visit via telephone Note Due to COVID-19 pandemic this visit was conducted virtually. This visit type was conducted due to national recommendations for restrictions regarding the COVID-19 Pandemic (e.g. social distancing, sheltering in place) in an effort to limit this patient's exposure and mitigate transmission in our community. All issues noted in this document were discussed and addressed.  A physical exam was not performed with this format.  I connected with Spencer Miller. on 05/03/19 at 11:54 AM by telephone and verified that I am speaking with the correct person using two identifiers. Spencer Miller. is currently located at home and no one is currently with him  during visit. The provider, Evelina Dun, FNP is located in their office at time of visit.  I discussed the limitations, risks, security and privacy concerns of performing an evaluation and management service by telephone and the availability of in person appointments. I also discussed with the patient that there may be a patient responsible charge related to this service. The patient expressed understanding and agreed to proceed.   History and Present Illness:  Rash This is a new problem. The current episode started yesterday. The problem has been gradually worsening since onset. The affected locations include the left upper leg (left inner thigh). The rash is characterized by pain, itchiness, redness and swelling. He was exposed to a spider bite. Pertinent negatives include no congestion, cough, diarrhea, fatigue, fever, joint pain, shortness of breath or sore throat. Past treatments include anti-itch cream. The treatment provided mild relief.      Review of Systems  Constitutional: Negative for fatigue and fever.  HENT: Negative for congestion and sore throat.   Respiratory: Negative for cough and shortness of breath.   Gastrointestinal: Negative for diarrhea.  Musculoskeletal: Negative for joint pain.   Skin: Positive for rash.  All other systems reviewed and are negative.    Observations/Objective: No SOB or distress noted   Assessment and Plan: 1. Spider bite wound, accidental or unintentional, initial encounter Do not scratch Keep clean and dry Report if area worsens or does not improve - doxycycline (VIBRA-TABS) 100 MG tablet; Take 1 tablet (100 mg total) by mouth 2 (two) times daily.  Dispense: 20 tablet; Refill: 0     I discussed the assessment and treatment plan with the patient. The patient was provided an opportunity to ask questions and all were answered. The patient agreed with the plan and demonstrated an understanding of the instructions.   The patient was advised to call back or seek an in-person evaluation if the symptoms worsen or if the condition fails to improve as anticipated.  The above assessment and management plan was discussed with the patient. The patient verbalized understanding of and has agreed to the management plan. Patient is aware to call the clinic if symptoms persist or worsen. Patient is aware when to return to the clinic for a follow-up visit. Patient educated on when it is appropriate to go to the emergency department.   Time call ended:  12:02 pm  I provided 8 minutes of non-face-to-face time during this encounter.    Evelina Dun, FNP

## 2019-05-09 ENCOUNTER — Other Ambulatory Visit: Payer: Self-pay

## 2019-05-10 ENCOUNTER — Other Ambulatory Visit: Payer: Self-pay

## 2019-05-10 ENCOUNTER — Ambulatory Visit (INDEPENDENT_AMBULATORY_CARE_PROVIDER_SITE_OTHER): Payer: PPO | Admitting: *Deleted

## 2019-05-10 DIAGNOSIS — Z23 Encounter for immunization: Secondary | ICD-10-CM

## 2019-05-15 DIAGNOSIS — W57XXXA Bitten or stung by nonvenomous insect and other nonvenomous arthropods, initial encounter: Secondary | ICD-10-CM | POA: Diagnosis not present

## 2019-05-15 DIAGNOSIS — R21 Rash and other nonspecific skin eruption: Secondary | ICD-10-CM | POA: Diagnosis not present

## 2019-09-29 ENCOUNTER — Other Ambulatory Visit: Payer: Self-pay

## 2019-10-02 ENCOUNTER — Other Ambulatory Visit: Payer: Self-pay

## 2019-10-02 ENCOUNTER — Ambulatory Visit (INDEPENDENT_AMBULATORY_CARE_PROVIDER_SITE_OTHER): Payer: PPO | Admitting: Family Medicine

## 2019-10-02 ENCOUNTER — Encounter: Payer: Self-pay | Admitting: Family Medicine

## 2019-10-02 VITALS — BP 131/79 | HR 99 | Temp 98.4°F | Ht 71.0 in | Wt 159.6 lb

## 2019-10-02 DIAGNOSIS — D649 Anemia, unspecified: Secondary | ICD-10-CM

## 2019-10-02 DIAGNOSIS — N3943 Post-void dribbling: Secondary | ICD-10-CM | POA: Diagnosis not present

## 2019-10-02 DIAGNOSIS — Z136 Encounter for screening for cardiovascular disorders: Secondary | ICD-10-CM

## 2019-10-02 DIAGNOSIS — F17210 Nicotine dependence, cigarettes, uncomplicated: Secondary | ICD-10-CM

## 2019-10-02 DIAGNOSIS — Z13220 Encounter for screening for lipoid disorders: Secondary | ICD-10-CM

## 2019-10-02 DIAGNOSIS — N401 Enlarged prostate with lower urinary tract symptoms: Secondary | ICD-10-CM | POA: Diagnosis not present

## 2019-10-02 MED ORDER — TAMSULOSIN HCL 0.4 MG PO CAPS: 0.4000 mg | ORAL_CAPSULE | Freq: Every day | ORAL | 3 refills | Status: DC

## 2019-10-02 NOTE — Progress Notes (Signed)
BP 131/79   Pulse 99   Temp 98.4 F (36.9 C) (Temporal)   Ht 5' 11"  (1.803 m)   Wt 159 lb 9.6 oz (72.4 kg)   SpO2 97%   BMI 22.26 kg/m    Subjective:   Patient ID: Spencer Miller., male    DOB: [birth year], 74 y.o.   MRN: [id number]  HPI: Spencer Miller. is a 74 y.o. male presenting on 10/02/2019 for COPD (6 month - labs. Patient states that he will just dose off when sitting down ), Cold Extremity (Patient states that it has been 6 months on and off ), trouble sleeping (x 4 months ), and Dehydration   HPI COPD Patient is coming in for COPD recheck today.  He is currently on no medication.  He has a mild chronic cough but denies any major coughing spells or wheezing spells.  He has 0nighttime symptoms per week and 0daytime symptoms per week currently.   BPH Patient is coming in for recheck on BPH Symptoms: Nocturia and some frequency Medication: No medication but will try Flomax Last PSA: 0.7 on 03/13/2019  Long term smoking history Patient has a long-term smoking history and has no desire to quit but with his at least 40-pack-year history and like to order a AAA ultrasound today.  His COPD is stable.  With him being a smoker and we said that the extremities being cold or could be due to age or could be due to his circulation, pulses are intact.  He says trouble sleeping is improving and dehydration is improving he is trying to focus on drinking more fluids  Relevant past medical, surgical, family and social history reviewed and updated as indicated. Interim medical history since our last visit reviewed. Allergies and medications reviewed and updated.  Review of Systems  Constitutional: Negative for chills and fever.  Eyes: Negative for visual disturbance.  Respiratory: Negative for shortness of breath and wheezing.   Cardiovascular: Negative for chest pain and leg swelling.  Musculoskeletal: Negative for back pain and gait problem.  Skin: Negative for rash.   Neurological: Negative for dizziness, weakness and light-headedness.  Psychiatric/Behavioral: Positive for sleep disturbance. Negative for self-injury and suicidal ideas.  All other systems reviewed and are negative.   Per HPI unless specifically indicated above   Allergies as of 10/02/2019   No Known Allergies     Medication List       Accurate as of October 02, 2019 11:59 PM. If you have any questions, ask your nurse or doctor.        STOP taking these medications   doxycycline 100 MG tablet Commonly known as: VIBRA-TABS Stopped by: Fransisca Kaufmann Veronika Heard, MD     TAKE these medications   tamsulosin 0.4 MG Caps capsule Commonly known as: FLOMAX Take 1 capsule (0.4 mg total) by mouth daily. Started by: Fransisca Kaufmann Kortny Lirette, MD        Objective:   BP 131/79   Pulse 99   Temp 98.4 F (36.9 C) (Temporal)   Ht 5' 11"  (1.803 m)   Wt 159 lb 9.6 oz (72.4 kg)   SpO2 97%   BMI 22.26 kg/m   Wt Readings from Last 3 Encounters:  10/02/19 159 lb 9.6 oz (72.4 kg)  03/13/19 155 lb 6.4 oz (70.5 kg)  11/02/18 156 lb 4 oz (70.9 kg)    Physical Exam Vitals and nursing note reviewed.  Constitutional:      General: He is not in  acute distress.    Appearance: He is well-developed. He is not diaphoretic.  Eyes:     General: No scleral icterus.    Conjunctiva/sclera: Conjunctivae normal.  Neck:     Thyroid: No thyromegaly.  Cardiovascular:     Rate and Rhythm: Normal rate and regular rhythm.     Heart sounds: Normal heart sounds. No murmur.  Pulmonary:     Effort: Pulmonary effort is normal. No respiratory distress.     Breath sounds: Normal breath sounds. No wheezing.  Musculoskeletal:        General: Normal range of motion.     Cervical back: Neck supple.  Lymphadenopathy:     Cervical: No cervical adenopathy.  Skin:    General: Skin is warm and dry.     Findings: No rash.  Neurological:     Mental Status: He is alert and oriented to person, place, and time.      Coordination: Coordination normal.  Psychiatric:        Behavior: Behavior normal.       Assessment & Plan:   Problem List Items Addressed This Visit      Genitourinary   BPH (benign prostatic hyperplasia)   Relevant Medications   tamsulosin (FLOMAX) 0.4 MG CAPS capsule   Other Relevant Orders   PSA, total and free (Completed)     Other   Fatigue associated with anemia - Primary   Relevant Orders   CBC with Differential/Platelet (Completed)   CMP14+EGFR (Completed)   TSH (Completed)   Smokes less than 1 pack a day with greater than 40 pack year history    Other Visit Diagnoses    Lipid screening       Relevant Orders   Lipid panel (Completed)   Screening for AAA (abdominal aortic aneurysm)       Relevant Orders   VAS [place] AAA DUPLEX      start Flomax to see if it gives him some symptom relief, will recheck blood work today.  Discussed smoking cessation.  With smoking history we will do AAA ultrasound. Follow up plan: Return in about 1 year (around 10/01/2020), or if symptoms worsen or fail to improve, for bph and fatigue recheck.  Counseling provided for all of the vaccine components Orders Placed This Encounter  Procedures  . CBC with Differential/Platelet  . CMP14+EGFR  . Lipid panel  . TSH  . PSA, total and free  . VAS [place] AAA Burdette Dewain Platz, MD [facility] 10/08/2019, 9:55 PM

## 2019-10-03 LAB — LIPID PANEL
Chol/HDL Ratio: 2.5 ratio (ref 0.0–5.0)
Cholesterol, Total: 162 mg/dL (ref 100–199)
HDL: 65 mg/dL (ref >39)
LDL Chol Calc (NIH): 81 mg/dL (ref 0–99)
Triglycerides: 83 mg/dL (ref 0–149)
VLDL Cholesterol Cal: 16 mg/dL (ref 5–40)

## 2019-10-03 LAB — CMP14+EGFR
ALT: 12 IU/L (ref 0–44)
AST: 15 IU/L (ref 0–40)
Albumin/Globulin Ratio: 1.6 (ref 1.2–2.2)
Albumin: 4.1 g/dL (ref 3.7–4.7)
Alkaline Phosphatase: 85 IU/L (ref 39–117)
BUN/Creatinine Ratio: 13 (ref 10–24)
BUN: 13 mg/dL (ref 8–27)
Bilirubin Total: 0.3 mg/dL (ref 0.0–1.2)
CO2: 25 mmol/L (ref 20–29)
Calcium: 9.3 mg/dL (ref 8.6–10.2)
Chloride: 99 mmol/L (ref 96–106)
Creatinine, Ser: 1.02 mg/dL (ref 0.76–1.27)
GFR calc Af Amer: 84 mL/min/{1.73_m2} (ref >59)
GFR calc non Af Amer: 73 mL/min/{1.73_m2} (ref >59)
Globulin, Total: 2.6 g/dL (ref 1.5–4.5)
Glucose: 93 mg/dL (ref 65–99)
Potassium: 4.3 mmol/L (ref 3.5–5.2)
Sodium: 134 mmol/L (ref 134–144)
Total Protein: 6.7 g/dL (ref 6.0–8.5)

## 2019-10-03 LAB — PSA, TOTAL AND FREE
PSA, Free Pct: 47.1 %
PSA, Free: 0.33 ng/mL
Prostate Specific Ag, Serum: 0.7 ng/mL (ref 0.0–4.0)

## 2019-10-03 LAB — CBC WITH DIFFERENTIAL/PLATELET
Basophils Absolute: 0.1 10*3/uL (ref 0.0–0.2)
Basos: 1 %
EOS (ABSOLUTE): 0.2 10*3/uL (ref 0.0–0.4)
Eos: 3 %
Hematocrit: 43.6 % (ref 37.5–51.0)
Hemoglobin: 14.8 g/dL (ref 13.0–17.7)
Immature Grans (Abs): 0 10*3/uL (ref 0.0–0.1)
Immature Granulocytes: 0 %
Lymphocytes Absolute: 2.7 10*3/uL (ref 0.7–3.1)
Lymphs: 36 %
MCH: 31.7 pg (ref 26.6–33.0)
MCHC: 33.9 g/dL (ref 31.5–35.7)
MCV: 93 fL (ref 79–97)
Monocytes Absolute: 0.5 10*3/uL (ref 0.1–0.9)
Monocytes: 7 %
Neutrophils Absolute: 4 10*3/uL (ref 1.4–7.0)
Neutrophils: 53 %
Platelets: 295 10*3/uL (ref 150–450)
RBC: 4.67 x10E6/uL (ref 4.14–5.80)
RDW: 12.2 % (ref 11.6–15.4)
WBC: 7.4 10*3/uL (ref 3.4–10.8)

## 2019-10-03 LAB — TSH: TSH: 0.905 u[IU]/mL (ref 0.450–4.500)

## 2019-10-13 ENCOUNTER — Encounter: Payer: Self-pay | Admitting: Family Medicine

## 2019-10-13 DIAGNOSIS — Z136 Encounter for screening for cardiovascular disorders: Secondary | ICD-10-CM

## 2019-10-13 DIAGNOSIS — F17200 Nicotine dependence, unspecified, uncomplicated: Secondary | ICD-10-CM

## 2019-10-13 DIAGNOSIS — I700 Atherosclerosis of aorta: Secondary | ICD-10-CM

## 2019-11-08 ENCOUNTER — Other Ambulatory Visit: Payer: Self-pay | Admitting: Family Medicine

## 2019-11-08 DIAGNOSIS — Z136 Encounter for screening for cardiovascular disorders: Secondary | ICD-10-CM

## 2019-11-21 ENCOUNTER — Telehealth: Payer: Self-pay | Admitting: Family Medicine

## 2019-11-21 NOTE — Chronic Care Management (AMB) (Signed)
  Chronic Care Management   Note  11/21/2019 Name: Spencer Miller. MRN: [id number] DOB: [birth year]  Spencer Lose. is a 74 y.o. year old male who is a primary care patient of Dettinger, Fransisca Kaufmann, MD. I reached out to Spencer Lose. by phone today in response to a referral sent by Mr. UEL DAVIDOW Jr.'s health plan.     Spencer Miller was given information about Chronic Care Management services today including:  1. CCM service includes personalized support from designated clinical staff supervised by his physician, including individualized plan of care and coordination with other care providers 2. 24/7 contact phone numbers for assistance for urgent and routine care needs. 3. Service will only be billed when office clinical staff spend 20 minutes or more in a month to coordinate care. 4. Only one practitioner may furnish and bill the service in a calendar month. 5. The patient may stop CCM services at any time (effective at the end of the month) by phone call to the office staff. 6. The patient will be responsible for cost sharing (co-pay) of up to 20% of the service fee (after annual deductible is met).  Patient agreed to services and verbal consent obtained.   Follow up plan: Telephone appointment with care management team member scheduled for: 03/15/2020.  [place], [place] [id number] Direct Dial: [phone] [email or web address] Website: [email or web address]

## 2019-11-30 ENCOUNTER — Other Ambulatory Visit: Payer: PPO

## 2019-12-13 ENCOUNTER — Ambulatory Visit (INDEPENDENT_AMBULATORY_CARE_PROVIDER_SITE_OTHER): Payer: PPO

## 2019-12-13 ENCOUNTER — Other Ambulatory Visit: Payer: Self-pay

## 2019-12-13 DIAGNOSIS — Z136 Encounter for screening for cardiovascular disorders: Secondary | ICD-10-CM | POA: Diagnosis not present

## 2020-01-02 ENCOUNTER — Encounter: Payer: Self-pay | Admitting: Dermatology

## 2020-01-02 ENCOUNTER — Other Ambulatory Visit: Payer: Self-pay

## 2020-01-02 ENCOUNTER — Ambulatory Visit: Payer: PPO | Admitting: Dermatology

## 2020-01-02 DIAGNOSIS — D3610 Benign neoplasm of peripheral nerves and autonomic nervous system, unspecified: Secondary | ICD-10-CM

## 2020-01-02 DIAGNOSIS — L570 Actinic keratosis: Secondary | ICD-10-CM | POA: Diagnosis not present

## 2020-01-02 DIAGNOSIS — Z85828 Personal history of other malignant neoplasm of skin: Secondary | ICD-10-CM | POA: Diagnosis not present

## 2020-01-02 DIAGNOSIS — I872 Venous insufficiency (chronic) (peripheral): Secondary | ICD-10-CM

## 2020-01-06 ENCOUNTER — Encounter: Payer: Self-pay | Admitting: Dermatology

## 2020-01-06 NOTE — Progress Notes (Signed)
   Follow-Up Visit   Subjective  Tranquilino Cimino. is a 74 y.o. male who presents for the following: Annual Exam (Patient here today for skin check.  No new concerns).  Crust Location: Right cheek Duration: Less than 1 year Quality: Larger Associated Signs/Symptoms: Occasionally sore Modifying Factors:  Severity:  Timing: Context: History of nonmelanoma skin cancers  The following portions of the chart were reviewed this encounter and updated as appropriate: Tobacco  Allergies  Meds  Problems  Med Hx  Surg Hx  Fam Hx      Objective  Well appearing patient in no apparent distress; mood and affect are within normal limits.  A full examination was performed including scalp, head, eyes, ears, nose, lips, neck, chest, axillae, abdomen, back, buttocks, bilateral upper extremities, bilateral lower extremities, hands, feet, fingers, toes, fingernails, and toenails. All findings within normal limits unless otherwise noted below.   Assessment & Plan  AK (actinic keratosis) Right Parotid Area  Destruction of lesion - Right Parotid Area  Destruction method: cryotherapy   Informed consent: discussed and consent obtained   Lesion destroyed using liquid nitrogen: Yes   Outcome: patient tolerated procedure well with no complications    Neurofibroma Left Upper Back  Venous stasis dermatitis of left lower extremity Left Lower Leg - Anterior

## 2020-02-13 ENCOUNTER — Encounter: Payer: Self-pay | Admitting: Nurse Practitioner

## 2020-02-13 ENCOUNTER — Ambulatory Visit (INDEPENDENT_AMBULATORY_CARE_PROVIDER_SITE_OTHER): Payer: PPO | Admitting: Nurse Practitioner

## 2020-02-13 DIAGNOSIS — W57XXXA Bitten or stung by nonvenomous insect and other nonvenomous arthropods, initial encounter: Secondary | ICD-10-CM

## 2020-02-13 DIAGNOSIS — S30860A Insect bite (nonvenomous) of lower back and pelvis, initial encounter: Secondary | ICD-10-CM | POA: Diagnosis not present

## 2020-02-13 MED ORDER — DOXYCYCLINE HYCLATE 100 MG PO TABS
100.0000 mg | ORAL_TABLET | Freq: Two times a day (BID) | ORAL | 0 refills | Status: DC
Start: 2020-02-13 — End: 2020-12-23

## 2020-02-13 NOTE — Progress Notes (Signed)
Virtual Visit via telephone Note Due to COVID-19 pandemic this visit was conducted virtually. This visit type was conducted due to national recommendations for restrictions regarding the COVID-19 Pandemic (e.g. social distancing, sheltering in place) in an effort to limit this patient's exposure and mitigate transmission in our community. All issues noted in this document were discussed and addressed.  A physical exam was not performed with this format.  I connected with Spencer Miller. on 02/13/20 at 1:35 by telephone and verified that I am speaking with the correct person using two identifiers. Spencer Miller. is currently located at home and  His wife is currently with him during visit. The provider, Mary-Margaret Hassell Done, FNP is located in their office at time of visit.  I discussed the limitations, risks, security and privacy concerns of performing an evaluation and management service by telephone and the availability of in person appointments. I also discussed with the patient that there may be a patient responsible charge related to this service. The patient expressed understanding and agreed to proceed.   History and Present Illness:   Chief Complaint: Tick Removal   HPI Removed a tick last night. Has erythematous macular annular area at site. wa there longer then 24 hours. The tick was a big dog tick.    Review of Systems  Constitutional: Negative.  Negative for chills, diaphoresis, fever and weight loss.  Eyes: Negative for blurred vision, double vision and pain.  Respiratory: Negative for shortness of breath.   Cardiovascular: Negative for chest pain, palpitations, orthopnea and leg swelling.  Gastrointestinal: Negative for abdominal pain.  Skin: Negative for rash.  Neurological: Negative for dizziness, sensory change, loss of consciousness, weakness and headaches.  Endo/Heme/Allergies: Negative for polydipsia. Does not bruise/bleed easily.  Psychiatric/Behavioral:  Negative for memory loss. The patient does not have insomnia.   All other systems reviewed and are negative.    Observations/Objective: Alert and oriented- answers all questions appropriately No distress     Assessment and Plan: Spencer Miller. in today with chief complaint of Tick Removal   1. Tick bite, initial encounter Do  Not pick or scratch at area Meds ordered this encounter  Medications  . doxycycline (VIBRA-TABS) 100 MG tablet    Sig: Take 1 tablet (100 mg total) by mouth 2 (two) times daily. 1 po bid    Dispense:  42 tablet    Refill:  0    Order Specific Question:   Supervising Provider    Answer:   Caryl Pina A [[id number]]        Follow Up Instructions: prn    I discussed the assessment and treatment plan with the patient. The patient was provided an opportunity to ask questions and all were answered. The patient agreed with the plan and demonstrated an understanding of the instructions.   The patient was advised to call back or seek an in-person evaluation if the symptoms worsen or if the condition fails to improve as anticipated.  The above assessment and management plan was discussed with the patient. The patient verbalized understanding of and has agreed to the management plan. Patient is aware to call the clinic if symptoms persist or worsen. Patient is aware when to return to the clinic for a follow-up visit. Patient educated on when it is appropriate to go to the emergency department.   Time call ended:  1:47  I provided 12 minutes of non-face-to-face time during this encounter.    Mary-Margaret Hassell Done, FNP

## 2020-03-13 ENCOUNTER — Encounter: Payer: Self-pay | Admitting: Family Medicine

## 2020-03-15 ENCOUNTER — Ambulatory Visit (INDEPENDENT_AMBULATORY_CARE_PROVIDER_SITE_OTHER): Payer: PPO | Admitting: *Deleted

## 2020-03-15 DIAGNOSIS — J439 Emphysema, unspecified: Secondary | ICD-10-CM

## 2020-03-15 DIAGNOSIS — I700 Atherosclerosis of aorta: Secondary | ICD-10-CM

## 2020-03-15 DIAGNOSIS — Z87820 Personal history of traumatic brain injury: Secondary | ICD-10-CM

## 2020-03-15 DIAGNOSIS — F17200 Nicotine dependence, unspecified, uncomplicated: Secondary | ICD-10-CM

## 2020-03-15 NOTE — Chronic Care Management (AMB) (Signed)
Chronic Care Management   Follow Up Note   03/15/2020 Name: Spencer Miller. MRN: [id number] DOB: 1946-07-15  Referred by: Dettinger, Fransisca Kaufmann, MD Reason for referral : Chronic Care Management (Initial visit)   Spencer Miller. is a 74 y.o. year old male who is a primary care patient of Dettinger, Fransisca Kaufmann, MD. The CCM team was consulted for assistance with chronic disease management and care coordination needs.    Review of patient status, including review of consultants reports, relevant laboratory and other test results, and collaboration with appropriate care team members and the patient's provider was performed as part of comprehensive patient evaluation and provision of chronic care management services.    Subjective: I spoke with Spencer Miller by telephone today regarding management of his chronic medical conditions. He feels that everything is well managed at this time.  SDOH (Social Determinants of Health) assessments performed: Yes See Care Plan activities for detailed interventions related to [facility])     Outpatient Encounter Medications as of 03/15/2020  Medication Sig  . doxycycline (VIBRA-TABS) 100 MG tablet Take 1 tablet (100 mg total) by mouth 2 (two) times daily. 1 po bid   No facility-administered encounter medications on file as of 03/15/2020.      RN Care Plan   . Chronic Disease Management Needs       CARE PLAN ENTRY (see longtitudinal plan of care for additional care plan information)  Current Barriers:  . Chronic Disease Management support, education, and care coordination needs related to Emphysema,  BPH, aortic atherosclerosis, squamous cell carcinoma hx, hx of traumatic brain injury  Clinical Goal(s) related to Emphysema,  BPH, aortic atherosclerosis, squamous cell carcinoma hx, hx of traumatic brain injury:  Over the next 90 days, patient will:  . Work with the care management team to address educational, disease management, and care coordination needs   . Call provider office for new or worsened signs and symptoms . Call care management team with questions or concerns  Interventions related to Emphysema,  BPH, aortic atherosclerosis, squamous cell carcinoma hx, hx of traumatic brain injury:  . Evaluation of current treatment plans and patient's adherence to plan as established by provider . Assessed patient understanding of disease states . Assessed patient's education and care coordination needs . Chart reviewed, including recent office notes and lab results . Discussed ability to perform ADLs. Patient is able to independently care for himself and is assisting his wife who recently had a femur fracture.  . Discussed SDOH. Smoking cessation is the only need identified.  . Reviewed and discussed medications. On doxycycline acutely for tick bite. Does not take any medications on a regular basis.  . Discussed upcoming dermatology appt to assess for melanoma . Discussed hx of traumatic brain injury and squamous cell carcinoma . Encouraged to reach out to [facility] team as needed . Advised that RN Care Manager will follow-up over the next 90 days and re-evaluate need for enrollment in CCM program at that time.  Patient Self Care Activities related to Emphysema,  BPH, aortic atherosclerosis, squamous cell carcinoma hx, hx of traumatic brain injury:  . Patient is able to perform ADLs and IADLs independently   Initial goal documentation         Plan:  The care management team will reach out to the patient again over the next 90 days.    Spencer Miller, BSN, RN-BC Embedded Chronic Care Manager [facility] [place] [facility] Direct Dial: [phone]

## 2020-03-15 NOTE — Patient Instructions (Signed)
Visit Information  Goals Addressed            This Visit's Progress   . Chronic Disease Management Needs       CARE PLAN ENTRY (see longtitudinal plan of care for additional care plan information)  Current Barriers:  . Chronic Disease Management support, education, and care coordination needs related to Emphysema,  BPH, aortic atherosclerosis, squamous cell carcinoma hx, hx of traumatic brain injury  Clinical Goal(s) related to Emphysema,  BPH, aortic atherosclerosis, squamous cell carcinoma hx, hx of traumatic brain injury:  Over the next 90 days, patient will:  . Work with the care management team to address educational, disease management, and care coordination needs  . Call provider office for new or worsened signs and symptoms . Call care management team with questions or concerns  Interventions related to Emphysema,  BPH, aortic atherosclerosis, squamous cell carcinoma hx, hx of traumatic brain injury:  . Evaluation of current treatment plans and patient's adherence to plan as established by provider . Assessed patient understanding of disease states . Assessed patient's education and care coordination needs . Chart reviewed, including recent office notes and lab results . Discussed ability to perform ADLs. Patient is able to independently care for himself and is assisting his wife who recently had a femur fracture.  . Discussed SDOH. Smoking cessation is the only need identified.  . Reviewed and discussed medications. On doxycycline acutely for tick bite. Does not take any medications on a regular basis.  . Discussed upcoming dermatology appt to assess for melanoma . Discussed hx of traumatic brain injury and squamous cell carcinoma . Encouraged to reach out to [facility] team as needed . Advised that RN Care Manager will follow-up over the next 90 days and re-evaluate need for enrollment in CCM program at that time.  Patient Self Care Activities related to Emphysema,  BPH, aortic  atherosclerosis, squamous cell carcinoma hx, hx of traumatic brain injury:  . Patient is able to perform ADLs and IADLs independently   Initial goal documentation        Mr. Tellefsen was given information about Chronic Care Management services today including:  1. CCM service includes personalized support from designated clinical staff supervised by his physician, including individualized plan of care and coordination with other care providers 2. 24/7 contact phone numbers for assistance for urgent and routine care needs. 3. Service will only be billed when office clinical staff spend 20 minutes or more in a month to coordinate care. 4. Only one practitioner may furnish and bill the service in a calendar month. 5. The patient may stop CCM services at any time (effective at the end of the month) by phone call to the office staff. 6. The patient will be responsible for cost sharing (co-pay) of up to 20% of the service fee (after annual deductible is met).  Patient agreed to services and verbal consent obtained.   Patient verbalizes understanding of instructions provided today.   The care management team will reach out to the patient again over the next 90 days.   Chong Sicilian, BSN, RN-BC Embedded Chronic Care Manager [facility] [place] [facility] Direct Dial: [phone]

## 2020-04-09 ENCOUNTER — Ambulatory Visit: Payer: PPO | Admitting: Dermatology

## 2020-06-05 ENCOUNTER — Ambulatory Visit: Payer: PPO

## 2020-06-07 ENCOUNTER — Telehealth (INDEPENDENT_AMBULATORY_CARE_PROVIDER_SITE_OTHER): Payer: PPO | Admitting: Family Medicine

## 2020-06-07 ENCOUNTER — Encounter: Payer: Self-pay | Admitting: Family Medicine

## 2020-06-07 DIAGNOSIS — S90562A Insect bite (nonvenomous), left ankle, initial encounter: Secondary | ICD-10-CM | POA: Diagnosis not present

## 2020-06-07 DIAGNOSIS — W57XXXA Bitten or stung by nonvenomous insect and other nonvenomous arthropods, initial encounter: Secondary | ICD-10-CM | POA: Diagnosis not present

## 2020-06-07 MED ORDER — TRIAMCINOLONE ACETONIDE 0.1 % EX CREA: 1.0000 "application " | TOPICAL_CREAM | Freq: Two times a day (BID) | CUTANEOUS | 0 refills | Status: DC

## 2020-06-07 NOTE — Progress Notes (Signed)
   Virtual Visit via Telephone Note  I connected with Spencer Miller. on 06/07/20 at 4:06 AM by telephone and verified that I am speaking with the correct person using two identifiers. Spencer Miller. is currently located in the car and his wife is currently with him during this visit. The provider, Loman Brooklyn, FNP is located in their office at time of visit.  I discussed the limitations, risks, security and privacy concerns of performing an evaluation and management service by telephone and the availability of in person appointments. I also discussed with the patient that there may be a patient responsible charge related to this service. The patient expressed understanding and agreed to proceed.  Subjective: PCP: Dettinger, Fransisca Kaufmann, MD  Chief Complaint  Patient presents with  . Leg Swelling   Patient reports [place] and sustained a bug bite just above his left ankle that is red and very itchy.  There is only the one bite.  He just saw his friend he was fishing with at the store and states his friend told him that it was a chigger bite and advised him to cover it with nail polish.  Patient states the bite is half centimeter.  Denies any swelling or drainage.   ROS: Per HPI  Current Outpatient Medications:  .  doxycycline (VIBRA-TABS) 100 MG tablet, Take 1 tablet (100 mg total) by mouth 2 (two) times daily. 1 po bid, Disp: 42 tablet, Rfl: 0  No Known Allergies Past Medical History:  Diagnosis Date  . Cataract    Bilateral   . SCCA (squamous cell carcinoma) of skin 05/23/2013   Right Cheek(well diff) (curet and 5FU)  . SCCA (squamous cell carcinoma) of skin 11/26/2015   Left Cheek(well diff) (curet and 5FU)    Observations/Objective: A&O  No respiratory distress or wheezing audible over the phone Mood, judgement, and thought processes all WNL   Assessment and Plan: 1. Insect bite of left ankle, initial encounter - Rx'd triamcinolone cream  to apply twice daily. - triamcinolone cream (KENALOG) 0.1 %; Apply 1 application topically 2 (two) times daily.  Dispense: 80 g; Refill: 0   Follow Up Instructions:  I discussed the assessment and treatment plan with the patient. The patient was provided an opportunity to ask questions and all were answered. The patient agreed with the plan and demonstrated an understanding of the instructions.   The patient was advised to call back or seek an in-person evaluation if the symptoms worsen or if the condition fails to improve as anticipated.  The above assessment and management plan was discussed with the patient. The patient verbalized understanding of and has agreed to the management plan. Patient is aware to call the clinic if symptoms persist or worsen. Patient is aware when to return to the clinic for a follow-up visit. Patient educated on when it is appropriate to go to the emergency department.   Time call ended: 4:12 PM  I provided 8 minutes of non-face-to-face time during this encounter.  Hendricks Limes, MSN, APRN, FNP-C [facility] 06/07/20

## 2020-06-11 ENCOUNTER — Ambulatory Visit: Payer: PPO | Admitting: Dermatology

## 2020-07-08 ENCOUNTER — Other Ambulatory Visit: Payer: Self-pay | Admitting: *Deleted

## 2020-07-08 ENCOUNTER — Other Ambulatory Visit: Payer: Self-pay

## 2020-07-08 ENCOUNTER — Other Ambulatory Visit: Payer: PPO

## 2020-07-08 DIAGNOSIS — Z1211 Encounter for screening for malignant neoplasm of colon: Secondary | ICD-10-CM

## 2020-07-09 LAB — FECAL OCCULT BLOOD, IMMUNOCHEMICAL: Fecal Occult Bld: NEGATIVE

## 2020-07-16 DIAGNOSIS — J0100 Acute maxillary sinusitis, unspecified: Secondary | ICD-10-CM | POA: Diagnosis not present

## 2020-07-16 DIAGNOSIS — R059 Cough, unspecified: Secondary | ICD-10-CM | POA: Diagnosis not present

## 2020-07-16 DIAGNOSIS — R0981 Nasal congestion: Secondary | ICD-10-CM | POA: Diagnosis not present

## 2020-07-16 DIAGNOSIS — R519 Headache, unspecified: Secondary | ICD-10-CM | POA: Diagnosis not present

## 2020-09-27 ENCOUNTER — Telehealth: Payer: PPO

## 2020-12-23 ENCOUNTER — Ambulatory Visit (INDEPENDENT_AMBULATORY_CARE_PROVIDER_SITE_OTHER): Payer: PPO | Admitting: Family Medicine

## 2020-12-23 ENCOUNTER — Other Ambulatory Visit: Payer: Self-pay

## 2020-12-23 ENCOUNTER — Encounter: Payer: Self-pay | Admitting: Family Medicine

## 2020-12-23 VITALS — BP 126/78 | HR 87 | Temp 97.6°F | Ht 71.0 in | Wt 162.5 lb

## 2020-12-23 DIAGNOSIS — W57XXXA Bitten or stung by nonvenomous insect and other nonvenomous arthropods, initial encounter: Secondary | ICD-10-CM

## 2020-12-23 DIAGNOSIS — S30861A Insect bite (nonvenomous) of abdominal wall, initial encounter: Secondary | ICD-10-CM

## 2020-12-23 MED ORDER — DOXYCYCLINE HYCLATE 100 MG PO TABS: 100.0000 mg | ORAL_TABLET | Freq: Two times a day (BID) | ORAL | 0 refills | Status: AC

## 2020-12-23 NOTE — Progress Notes (Signed)
Acute Office Visit  Subjective:    Patient ID: Spencer Salguero., male    DOB: [birth year], 75 y.o.   MRN: [id number]  Chief Complaint  Patient presents with  . Tick Removal    HPI Patient is in today for a tick bite over the weekend. The bite occurred on his stomach. The thinks the tick was attached for about 12 hours. He used a tool called tick-ease to remove the tick. It was a deer tick. The area is swollen and tender. It does look like a bullseye rash around the bite. Denies drainage, fever, or chills. No nausea, vomiting, chest pain, neck pain, or headache.   Past Medical History:  Diagnosis Date  . Cataract    Bilateral   . SCCA (squamous cell carcinoma) of skin 05/23/2013   Right Cheek(well diff) (curet and 5FU)  . SCCA (squamous cell carcinoma) of skin 11/26/2015   Left Cheek(well diff) (curet and 5FU)    Past Surgical History:  Procedure Laterality Date  . CATARACT EXTRACTION, BILATERAL    . FRACTURE SURGERY    . HERNIA REPAIR      Family History  Problem Relation Age of Onset  . Obesity Mother   . Melanoma Father   . Cancer Brother        stomach  . Obesity Sister   . Obesity Sister   . Obesity Sister   . Healthy Son   . Healthy Son   . Healthy Son   . Healthy Son     Social History   Socioeconomic History  . Marital status: Married    Spouse name: Not on file  . Number of children: 4  . Years of education: Not on file  . Highest education level: Not on file  Occupational History    Comment: Retired [employer] 25 years    Comment: Retired [employer] 15 years  Tobacco Use  . Smoking status: Current Every Day Smoker    Packs/day: 1.00    Years: 40.00    Pack years: 40.00    Types: Cigarettes  . Smokeless tobacco: Never Used  Vaping Use  . Vaping Use: Never used  Substance and Sexual Activity  . Alcohol use: Yes    Alcohol/week: 14.0 standard drinks    Types: 14 Cans of beer per week  . Drug use: No  . Sexual activity: Not  Currently  Other Topics Concern  . Not on file  Social History Narrative  . Not on file   Social Determinants of Health   Financial Resource Strain: Low Risk   . Difficulty of Paying Living Expenses: Not hard at all  Food Insecurity: No Food Insecurity  . Worried About Charity fundraiser in the Last Year: Never true  . Ran Out of Food in the Last Year: Never true  Transportation Needs: No Transportation Needs  . Lack of Transportation (Medical): No  . Lack of Transportation (Non-Medical): No  Physical Activity: Sufficiently Active  . Days of Exercise per Week: 7 days  . Minutes of Exercise per Session: 30 min  Stress: No Stress Concern Present  . Feeling of Stress : Only a little  Social Connections: Moderately Isolated  . Frequency of Communication with Friends and Family: More than three times a week  . Frequency of Social Gatherings with Friends and Family: More than three times a week  . Attends Religious Services: Never  . Active Member of Clubs or Organizations: No  . Attends Club  or Organization Meetings: Never  . Marital Status: Married  Human resources officer Violence: Not At Risk  . Fear of Current or Ex-Partner: No  . Emotionally Abused: No  . Physically Abused: No  . Sexually Abused: No    Outpatient Medications Prior to Visit  Medication Sig Dispense Refill  . Multiple Vitamins-Minerals (PRESERVISION AREDS 2+MULTI VIT PO) Take by mouth.    . doxycycline (VIBRA-TABS) 100 MG tablet Take 1 tablet (100 mg total) by mouth 2 (two) times daily. 1 po bid 42 tablet 0  . triamcinolone cream (KENALOG) 0.1 % Apply 1 application topically 2 (two) times daily. 80 g 0   No facility-administered medications prior to visit.    No Known Allergies  Review of Systems As per HPI.     Objective:    Physical Exam Vitals and nursing note reviewed.  Constitutional:      General: He is not in acute distress.    Appearance: Normal appearance. He is not ill-appearing,  toxic-appearing or diaphoretic.  Pulmonary:     Effort: Pulmonary effort is normal. No respiratory distress.  Abdominal:    Musculoskeletal:     Cervical back: Normal range of motion. No rigidity.     Right lower leg: No edema.     Left lower leg: No edema.  Skin:    General: Skin is warm and dry.  Neurological:     General: No focal deficit present.     Mental Status: He is alert and oriented to person, place, and time.     Gait: Gait normal.  Psychiatric:        Mood and Affect: Mood normal.        Behavior: Behavior normal.     BP 126/78   Pulse 87   Temp 97.6 F (36.4 C) (Temporal)   Ht 5\' 11"  (1.803 m)   Wt 162 lb 8 oz (73.7 kg)   BMI 22.66 kg/m  Wt Readings from Last 3 Encounters:  12/23/20 162 lb 8 oz (73.7 kg)  10/02/19 159 lb 9.6 oz (72.4 kg)  03/13/19 155 lb 6.4 oz (70.5 kg)    There are no preventive care reminders to display for this patient.  There are no preventive care reminders to display for this patient.   Lab Results  Component Value Date   TSH 0.905 10/02/2019   Lab Results  Component Value Date   WBC 7.4 10/02/2019   HGB 14.8 10/02/2019   HCT 43.6 10/02/2019   MCV 93 10/02/2019   PLT 295 10/02/2019   Lab Results  Component Value Date   NA 134 10/02/2019   K 4.3 10/02/2019   CO2 25 10/02/2019   GLUCOSE 93 10/02/2019   BUN 13 10/02/2019   CREATININE 1.02 10/02/2019   BILITOT 0.3 10/02/2019   ALKPHOS 85 10/02/2019   AST 15 10/02/2019   ALT 12 10/02/2019   PROT 6.7 10/02/2019   ALBUMIN 4.1 10/02/2019   CALCIUM 9.3 10/02/2019   Lab Results  Component Value Date   CHOL 162 10/02/2019   Lab Results  Component Value Date   HDL 65 10/02/2019   Lab Results  Component Value Date   LDLCALC 81 10/02/2019   Lab Results  Component Value Date   TRIG 83 10/02/2019   Lab Results  Component Value Date   CHOLHDL 2.5 10/02/2019   No results found for: HGBA1C     Assessment & Plan:   Spencer Miller was seen today for tick  removal.  Diagnoses and all  orders for this visit:  Tick bite of abdomen, initial encounter Deer tick bite with rash consist with erythema migrans. No other symptoms. Doxycycline as below. Handout given. Return to office for new or worsening symptoms, or if symptoms persist.  -     doxycycline (VIBRA-TABS) 100 MG tablet; Take 1 tablet (100 mg total) by mouth 2 (two) times daily for 10 days. 1 po bid  The patient indicates understanding of these issues and agrees with the plan.  Gwenlyn Perking, FNP

## 2020-12-23 NOTE — Patient Instructions (Signed)
Lyme Disease Lyme disease is an infection that can affect many parts of the body, including the skin, joints, and nervous system. It is a bacterial infection that starts from the bite of an infected tick. Over time, the infection can worsen, and some of the symptoms are similar to the flu. If Lyme disease is not treated, it may cause joint pain, swelling, numbness, problems thinking, fatigue, muscle weakness, and other problems. What are the causes? This condition is caused by bacteria called Borrelia burgdorferi.  You can get Lyme disease by being bitten by an infected tick.  Only black-legged, or Ixodes, ticks that are infected with the bacteria can cause Lyme disease.  The tick must be attached to your skin for a certain period of time to pass along the infection. This is usually 36-48 hours.  Deer often carry infected ticks. What increases the risk? The following factors may make you more likely to develop this condition:  Living in or visiting these areas in the [place] ? [place]. ? [place]. ? The [place].  Spending time in wooded or grassy areas.  Being outdoors with exposed skin.  Camping, gardening, hiking, fishing, hunting, or working outdoors.  Failing to remove a tick from your skin. What are the signs or symptoms? Symptoms of this condition may include:  Chills and fever.  Headache.  Fatigue.  General achiness.  Muscle pain.  Joint pain, often in the knees.  A round, red rash that surrounds the center of the tick bite. The center of the rash may be blood colored or have tiny blisters.  Swollen lymph glands.  Stiff neck.   How is this diagnosed? This condition is diagnosed based on:  Your symptoms and medical history.  A physical exam.  A blood test. How is this treated? The main treatment for this condition is antibiotic medicine, which is usually taken by mouth (orally).  The length of treatment depends on how soon after a  tick bite you begin taking the medicine. In some cases, treatment is necessary for several weeks.  If the infection is severe, antibiotics may need to be given through an IV that is inserted into one of your veins. Follow these instructions at home:  Take over-the-counter and prescription medicines only as told by your health care provider. Finish all antibiotic medicine, even when you start to feel better.  Ask your health care provider about taking a probiotic in between doses of your antibiotic to help avoid an upset stomach or diarrhea.  Check with your health care provider before supplementing your treatment. Many alternative therapies have not been proven and may be harmful to you.  Keep all follow-up visits as told by your health care provider. This is important. How is this prevented? You can become reinfected if you get another tick bite from an infected tick. Take these steps to help prevent an infection:  Cover your skin with light-colored clothing when you are outdoors in the spring and summer months.  Spray clothing and skin with bug spray. The spray should be 20-30% DEET. You can also treat clothing with permethrin, and let it dry before you wear it. Do not apply permethrin directly to your skin. Permethrin can also be used to treat camping gear and boots. Always read and follow the instructions that come with a bug spray or insecticide.  Avoid wooded, grassy, and shaded areas.  Remove yard litter, brush, trash, and plants that attract deer and rodents.  Check yourself for  ticks when you come indoors.  Wash clothing worn each day.  Shower after spending time outdoors.  Check your pets for ticks before they come inside.  If you find a tick attached to your skin: ? Remove it with tweezers. ? Clean your hands and the bite area with rubbing alcohol or soap and water. ? Dispose of the tick by putting it in rubbing alcohol, putting it in a sealed bag or container, or  flushing it down the toilet. ? You may choose to save the tick in a sealed container if you wish for it to be tested at a later time. Pregnant women should take special care to avoid tick bites because it is possible that the infection may be passed along to the fetus.   Contact a health care provider if:  You have symptoms after treatment.  You have removed a tick and want to bring it to your health care provider for testing. Get help right away if:  You have an irregular heartbeat.  You have chest pain.  You have nerve pain.  Your face feels numb.  You develop the following: ? A stiff neck. ? A severe headache. ? Severe nausea and vomiting. ? Sensitivity to light. Summary  Lyme disease is an infection that can affect many parts of the body, including the skin, joints, and nervous system.  This condition is caused by bacteria called Borrelia burgdorferi.  You can get Lyme disease by being bitten by an infected tick.  The main treatment for this condition is antibiotic medicine. This information is not intended to replace advice given to you by your health care provider. Make sure you discuss any questions you have with your health care provider. Document Revised: 12/16/2018 Document Reviewed: 11/10/2018 Elsevier Patient Education  [place].

## 2021-01-15 ENCOUNTER — Encounter: Payer: Self-pay | Admitting: Family Medicine

## 2021-01-17 ENCOUNTER — Ambulatory Visit (INDEPENDENT_AMBULATORY_CARE_PROVIDER_SITE_OTHER): Payer: PPO | Admitting: Family Medicine

## 2021-01-17 ENCOUNTER — Encounter: Payer: Self-pay | Admitting: Family Medicine

## 2021-01-17 ENCOUNTER — Other Ambulatory Visit: Payer: Self-pay

## 2021-01-17 VITALS — BP 112/72 | HR 71 | Ht 71.0 in | Wt 159.0 lb

## 2021-01-17 DIAGNOSIS — N3943 Post-void dribbling: Secondary | ICD-10-CM

## 2021-01-17 DIAGNOSIS — R918 Other nonspecific abnormal finding of lung field: Secondary | ICD-10-CM | POA: Diagnosis not present

## 2021-01-17 DIAGNOSIS — F17210 Nicotine dependence, cigarettes, uncomplicated: Secondary | ICD-10-CM | POA: Diagnosis not present

## 2021-01-17 DIAGNOSIS — J439 Emphysema, unspecified: Secondary | ICD-10-CM

## 2021-01-17 DIAGNOSIS — N401 Enlarged prostate with lower urinary tract symptoms: Secondary | ICD-10-CM | POA: Diagnosis not present

## 2021-01-17 NOTE — Progress Notes (Signed)
BP 112/72   Pulse 71   Ht 5\' 11"  (1.803 m)   Wt 159 lb (72.1 kg)   SpO2 98%   BMI 22.18 kg/m    Subjective:   Patient ID: Spencer Lose., male    DOB: 07-Jan-1946, 75 y.o.   MRN: [id number]  HPI: Spencer Kothari. is a 75 y.o. male presenting on 01/17/2021 for Medical Management of Chronic Issues   HPI COPD Patient is coming in for COPD recheck today.  He is currently on no medication and denies really having any shortness of breath, he does have some exertional shortness of breath but denies it being significant..  He has a mild chronic cough but denies any major coughing spells or wheezing spells.  He has 1nighttime symptoms per week and 1daytime symptoms per week currently.   Patient has a history of CT chest that had pulmonary nodules a couple years ago.  Never got follow-up, will do follow-up CT chest right now.  He has been getting more fatigue and concern that we need to screen him for this.  Patient continues to smoke and has a greater than 40-year pack history.  BPH Patient is coming in for recheck on BPH Symptoms: None currently Medication: None Last PSA: 1.2 at [place] 1 month ago  Relevant past medical, surgical, family and social history reviewed and updated as indicated. Interim medical history since our last visit reviewed. Allergies and medications reviewed and updated.  Review of Systems  Constitutional: Negative for chills and fever.  Eyes: Negative for visual disturbance.  Respiratory: Negative for shortness of breath and wheezing.   Cardiovascular: Negative for chest pain and leg swelling.  Musculoskeletal: Negative for back pain and gait problem.  Skin: Negative for rash.  Neurological: Negative for dizziness, weakness and light-headedness.  All other systems reviewed and are negative.   Per HPI unless specifically indicated above   Allergies as of 01/17/2021   No Known Allergies     Medication List       Accurate as of Jan 17, 2021  1:36  PM. If you have any questions, ask your nurse or doctor.        cholecalciferol 25 MCG (1000 UNIT) tablet Commonly known as: VITAMIN D3 Take 1,000 Units by mouth daily.   PRESERVISION AREDS 2+MULTI VIT PO Take by mouth.        Objective:   BP 112/72   Pulse 71   Ht 5\' 11"  (1.803 m)   Wt 159 lb (72.1 kg)   SpO2 98%   BMI 22.18 kg/m   Wt Readings from Last 3 Encounters:  01/17/21 159 lb (72.1 kg)  12/23/20 162 lb 8 oz (73.7 kg)  10/02/19 159 lb 9.6 oz (72.4 kg)    Physical Exam Vitals and nursing note reviewed.  Constitutional:      General: He is not in acute distress.    Appearance: He is well-developed. He is not diaphoretic.  Eyes:     General: No scleral icterus.    Conjunctiva/sclera: Conjunctivae normal.  Neck:     Thyroid: No thyromegaly.  Cardiovascular:     Rate and Rhythm: Normal rate and regular rhythm.     Heart sounds: Normal heart sounds. No murmur heard.   Pulmonary:     Effort: Pulmonary effort is normal. No respiratory distress.     Breath sounds: Normal breath sounds. No wheezing.  Musculoskeletal:        General: Normal range of motion.  Skin:  General: Skin is warm and dry.     Findings: No rash.  Neurological:     Mental Status: He is alert and oriented to person, place, and time.     Coordination: Coordination normal.  Psychiatric:        Behavior: Behavior normal.       Assessment & Plan:   Problem List Items Addressed This Visit      Respiratory   Emphysema of lung (Spencer Miller) - Primary     Genitourinary   BPH (benign prostatic hyperplasia)     Other   Pulmonary nodules   Relevant Orders   CT CHEST NODULE FOLLOW UP LOW DOSE W/O   Smokes less than 1 pack a day with greater than 40 pack year history   Relevant Orders   CT CHEST NODULE FOLLOW UP LOW DOSE W/O      He feels like he is doing well, will order CT chest to follow-up with nodules.  He has a little bit of fatigue and concern it may be leading to that.  His blood  work looked good from the [place]. Follow up plan: Return in about 6 months (around 07/20/2021), or if symptoms worsen or fail to improve, for BPH and COPD follow-up.  Counseling provided for all of the vaccine components Orders Placed This Encounter  Procedures  . CT CHEST NODULE FOLLOW UP LOW DOSE W/O    Caryl Pina, MD [facility] 01/17/2021, 1:36 PM

## 2021-01-31 ENCOUNTER — Ambulatory Visit (HOSPITAL_COMMUNITY)
Admission: RE | Admit: 2021-01-31 | Discharge: 2021-01-31 | Disposition: A | Discharge status: Home / Self Care | Payer: PPO | Source: Ambulatory Visit | Attending: Family Medicine | Admitting: Family Medicine

## 2021-01-31 ENCOUNTER — Other Ambulatory Visit: Payer: Self-pay

## 2021-01-31 DIAGNOSIS — R918 Other nonspecific abnormal finding of lung field: Secondary | ICD-10-CM | POA: Diagnosis not present

## 2021-01-31 DIAGNOSIS — F17210 Nicotine dependence, cigarettes, uncomplicated: Secondary | ICD-10-CM | POA: Insufficient documentation

## 2021-02-05 ENCOUNTER — Ambulatory Visit: Payer: PPO | Admitting: Dermatology

## 2021-03-11 ENCOUNTER — Ambulatory Visit: Payer: PPO | Admitting: Dermatology

## 2021-03-11 ENCOUNTER — Other Ambulatory Visit: Payer: Self-pay

## 2021-03-11 ENCOUNTER — Encounter: Payer: Self-pay | Admitting: Dermatology

## 2021-03-11 DIAGNOSIS — D3610 Benign neoplasm of peripheral nerves and autonomic nervous system, unspecified: Secondary | ICD-10-CM

## 2021-03-11 DIAGNOSIS — L821 Other seborrheic keratosis: Secondary | ICD-10-CM | POA: Diagnosis not present

## 2021-03-11 DIAGNOSIS — Z1283 Encounter for screening for malignant neoplasm of skin: Secondary | ICD-10-CM | POA: Diagnosis not present

## 2021-03-11 DIAGNOSIS — D3612 Benign neoplasm of peripheral nerves and autonomic nervous system, upper limb, including shoulder: Secondary | ICD-10-CM

## 2021-03-24 ENCOUNTER — Encounter: Payer: Self-pay | Admitting: Dermatology

## 2021-03-24 NOTE — Progress Notes (Signed)
   Follow-Up Visit   Subjective  Spencer Miller. is a 75 y.o. male who presents for the following: Annual Exam.  General skin check, history of skin cancers Location:  Duration:  Quality:  Associated Signs/Symptoms: Modifying Factors:  Severity:  Timing: Context:   Objective  Well appearing patient in no apparent distress; mood and affect are within normal limits. Full body skin exam.  No new or recurrent skin cancer, no atypical pigmented lesions.  Several dozen brown flattopped textured papules  Left Shoulder - Posterior Soft 5 mm pink dermal partially compressible papule    A full examination was performed including scalp, head, eyes, ears, nose, lips, neck, chest, axillae, abdomen, back, buttocks, bilateral upper extremities, bilateral lower extremities, hands, feet, fingers, toes, fingernails, and toenails. All findings within normal limits unless otherwise noted below.   Assessment & Plan    Screening for malignant neoplasm of skin  Yearly skin exams.  Encouraged to self examine with spouse twice annually.  Neurofibroma Left Shoulder - Posterior  Leave stable.  Seborrheic keratosis  Leave if stable      I, Lavonna Monarch, MD, have reviewed all documentation for this visit.  The documentation on 03/24/21 for the exam, diagnosis, procedures, and orders are all accurate and complete.

## 2021-04-24 ENCOUNTER — Encounter: Payer: Self-pay | Admitting: Family Medicine

## 2021-04-24 ENCOUNTER — Ambulatory Visit: Payer: PPO | Admitting: Family Medicine

## 2021-04-24 ENCOUNTER — Ambulatory Visit (INDEPENDENT_AMBULATORY_CARE_PROVIDER_SITE_OTHER): Payer: PPO | Admitting: Family Medicine

## 2021-04-24 ENCOUNTER — Other Ambulatory Visit: Payer: Self-pay

## 2021-04-24 VITALS — BP 122/76 | HR 85 | Ht 71.0 in | Wt 153.0 lb

## 2021-04-24 DIAGNOSIS — R1011 Right upper quadrant pain: Secondary | ICD-10-CM | POA: Diagnosis not present

## 2021-04-24 DIAGNOSIS — K7689 Other specified diseases of liver: Secondary | ICD-10-CM

## 2021-04-24 NOTE — Progress Notes (Signed)
BP 122/76   Pulse 85   Ht '5\' 11"'$  (1.803 m)   Wt 153 lb (69.4 kg)   SpO2 94%   BMI 21.34 kg/m    Subjective:   Patient ID: Spencer Miller., male    DOB: 1946/06/28, 75 y.o.   MRN: [id number]  HPI: Spencer Miller. is a 75 y.o. male presenting on 04/24/2021 for Abdominal Pain (RUQ, lower right chest pain) and decreased appetite   HPI Right upper quadrant abdominal pain Is coming in complaining of right upper quadrant abdominal pain.  He does not distally associate anything with eating.  He says right upper quadrant abdominal pain is near his ribs on the lower side.  He says he feels it more in the morning and then gets better throughout the day.  He says sometimes he feels full sooner but denies nausea.  He denies any bowel issues or diarrhea.  Patient denies any urinary issues pain or bloating.  Denies any vomiting.  Relevant past medical, surgical, family and social history reviewed and updated as indicated. Interim medical history since our last visit reviewed. Allergies and medications reviewed and updated.  Review of Systems  Constitutional:  Negative for chills and fever.  Eyes:  Negative for visual disturbance.  Respiratory:  Negative for shortness of breath and wheezing.   Cardiovascular:  Negative for chest pain and leg swelling.  Gastrointestinal:  Positive for abdominal pain. Negative for abdominal distention, anal bleeding, constipation, diarrhea, nausea and vomiting.  Musculoskeletal:  Negative for arthralgias, back pain and gait problem.  Skin:  Negative for rash.  All other systems reviewed and are negative.  Per HPI unless specifically indicated above   Allergies as of 04/24/2021   No Known Allergies      Medication List        Accurate as of April 24, 2021  4:55 PM. If you have any questions, ask your nurse or doctor.          cholecalciferol 25 MCG (1000 UNIT) tablet Commonly known as: VITAMIN D3 Take 1,000 Units by mouth daily.    PRESERVISION AREDS 2+MULTI VIT PO Take by mouth.         Objective:   BP 122/76   Pulse 85   Ht '5\' 11"'$  (1.803 m)   Wt 153 lb (69.4 kg)   SpO2 94%   BMI 21.34 kg/m   Wt Readings from Last 3 Encounters:  04/24/21 153 lb (69.4 kg)  01/17/21 159 lb (72.1 kg)  12/23/20 162 lb 8 oz (73.7 kg)    Physical Exam Vitals and nursing note reviewed.  Constitutional:      General: He is not in acute distress.    Appearance: He is well-developed. He is not diaphoretic.  Eyes:     General: No scleral icterus.       Right eye: No discharge.     Conjunctiva/sclera: Conjunctivae normal.     Pupils: Pupils are equal, round, and reactive to light.  Neck:     Thyroid: No thyromegaly.  Cardiovascular:     Rate and Rhythm: Normal rate and regular rhythm.     Heart sounds: Normal heart sounds. No murmur heard. Pulmonary:     Effort: Pulmonary effort is normal. No respiratory distress.     Breath sounds: Normal breath sounds. No wheezing.  Abdominal:     Tenderness: There is no abdominal tenderness. There is no right CVA tenderness, left CVA tenderness, guarding or rebound. Negative signs include Murphy's  sign.  Musculoskeletal:        General: Normal range of motion.     Cervical back: Neck supple.  Lymphadenopathy:     Cervical: No cervical adenopathy.  Skin:    General: Skin is warm and dry.     Findings: No rash.  Neurological:     Mental Status: He is alert and oriented to person, place, and time.     Coordination: Coordination normal.  Psychiatric:        Behavior: Behavior normal.      Assessment & Plan:   Problem List Items Addressed This Visit   None Visit Diagnoses     RUQ abdominal pain    -  Primary   Relevant Orders   US ABDOMEN LIMITED RUQ (LIVER/GB)   Hepatic cyst       Relevant Orders   US ABDOMEN LIMITED RUQ (LIVER/GB)       Bradycardia abdominal ultrasound to see if there is any issues with his gallbladder and liver.  Patient just had a CT chest  which did show some hepatic cysts but the lungs look fine. Follow up plan: Return if symptoms worsen or fail to improve.  Counseling provided for all of the vaccine components Orders Placed This Encounter  Procedures   US ABDOMEN LIMITED RUQ (LIVER/GB)    Caryl Pina, MD [facility] 04/24/2021, 4:55 PM

## 2021-05-05 ENCOUNTER — Other Ambulatory Visit: Payer: Self-pay

## 2021-05-05 ENCOUNTER — Ambulatory Visit (HOSPITAL_COMMUNITY)
Admission: RE | Admit: 2021-05-05 | Discharge: 2021-05-05 | Disposition: A | Discharge status: Home / Self Care | Payer: PPO | Source: Ambulatory Visit | Attending: Family Medicine | Admitting: Family Medicine

## 2021-05-05 DIAGNOSIS — K760 Fatty (change of) liver, not elsewhere classified: Secondary | ICD-10-CM | POA: Diagnosis not present

## 2021-05-05 DIAGNOSIS — R1011 Right upper quadrant pain: Secondary | ICD-10-CM | POA: Insufficient documentation

## 2021-05-05 DIAGNOSIS — K7689 Other specified diseases of liver: Secondary | ICD-10-CM | POA: Insufficient documentation

## 2021-05-08 ENCOUNTER — Encounter: Payer: Self-pay | Admitting: Family Medicine

## 2021-05-08 DIAGNOSIS — Z1211 Encounter for screening for malignant neoplasm of colon: Secondary | ICD-10-CM

## 2021-05-13 ENCOUNTER — Encounter: Payer: Self-pay | Admitting: Family Medicine

## 2021-05-13 ENCOUNTER — Other Ambulatory Visit: Payer: Self-pay

## 2021-05-13 DIAGNOSIS — Z13220 Encounter for screening for lipoid disorders: Secondary | ICD-10-CM

## 2021-05-13 DIAGNOSIS — N3943 Post-void dribbling: Secondary | ICD-10-CM

## 2021-05-13 DIAGNOSIS — Z1211 Encounter for screening for malignant neoplasm of colon: Secondary | ICD-10-CM

## 2021-05-14 ENCOUNTER — Encounter: Payer: Self-pay | Admitting: Internal Medicine

## 2021-05-18 ENCOUNTER — Encounter: Payer: Self-pay | Admitting: Family Medicine

## 2021-05-19 ENCOUNTER — Other Ambulatory Visit: Payer: PPO

## 2021-05-19 ENCOUNTER — Other Ambulatory Visit: Payer: Self-pay

## 2021-05-19 DIAGNOSIS — N3943 Post-void dribbling: Secondary | ICD-10-CM

## 2021-05-19 DIAGNOSIS — N401 Enlarged prostate with lower urinary tract symptoms: Secondary | ICD-10-CM | POA: Diagnosis not present

## 2021-05-19 DIAGNOSIS — Z13220 Encounter for screening for lipoid disorders: Secondary | ICD-10-CM

## 2021-05-19 NOTE — Telephone Encounter (Signed)
Need to go to the ED if you think you have appendicitis

## 2021-05-20 ENCOUNTER — Encounter: Payer: Self-pay | Admitting: Family Medicine

## 2021-05-20 LAB — CMP14+EGFR
ALT: 16 IU/L (ref 0–44)
AST: 22 IU/L (ref 0–40)
Albumin/Globulin Ratio: 1.8 (ref 1.2–2.2)
Albumin: 4.3 g/dL (ref 3.7–4.7)
Alkaline Phosphatase: 78 IU/L (ref 44–121)
BUN/Creatinine Ratio: 10 (ref 10–24)
BUN: 9 mg/dL (ref 8–27)
Bilirubin Total: 0.5 mg/dL (ref 0.0–1.2)
CO2: 23 mmol/L (ref 20–29)
Calcium: 9.3 mg/dL (ref 8.6–10.2)
Chloride: 98 mmol/L (ref 96–106)
Creatinine, Ser: 0.87 mg/dL (ref 0.76–1.27)
Globulin, Total: 2.4 g/dL (ref 1.5–4.5)
Glucose: 84 mg/dL (ref 65–99)
Potassium: 4.6 mmol/L (ref 3.5–5.2)
Sodium: 134 mmol/L (ref 134–144)
Total Protein: 6.7 g/dL (ref 6.0–8.5)
eGFR: 91 mL/min/{1.73_m2} (ref >59)

## 2021-05-20 LAB — LIPID PANEL
Chol/HDL Ratio: 3.3 ratio (ref 0.0–5.0)
Cholesterol, Total: 167 mg/dL (ref 100–199)
HDL: 51 mg/dL (ref >39)
LDL Chol Calc (NIH): 98 mg/dL (ref 0–99)
Triglycerides: 100 mg/dL (ref 0–149)
VLDL Cholesterol Cal: 18 mg/dL (ref 5–40)

## 2021-05-20 LAB — CBC WITH DIFFERENTIAL/PLATELET
Basophils Absolute: 0.1 10*3/uL (ref 0.0–0.2)
Basos: 1 %
EOS (ABSOLUTE): 0.2 10*3/uL (ref 0.0–0.4)
Eos: 2 %
Hematocrit: 39.5 % (ref 37.5–51.0)
Hemoglobin: 13.9 g/dL (ref 13.0–17.7)
Immature Grans (Abs): 0 10*3/uL (ref 0.0–0.1)
Immature Granulocytes: 0 %
Lymphocytes Absolute: 3.3 10*3/uL — ABNORMAL HIGH (ref 0.7–3.1)
Lymphs: 36 %
MCH: 32.7 pg (ref 26.6–33.0)
MCHC: 35.2 g/dL (ref 31.5–35.7)
MCV: 93 fL (ref 79–97)
Monocytes Absolute: 0.7 10*3/uL (ref 0.1–0.9)
Monocytes: 7 %
Neutrophils Absolute: 4.9 10*3/uL (ref 1.4–7.0)
Neutrophils: 54 %
Platelets: 265 10*3/uL (ref 150–450)
RBC: 4.25 x10E6/uL (ref 4.14–5.80)
RDW: 12.5 % (ref 11.6–15.4)
WBC: 9.2 10*3/uL (ref 3.4–10.8)

## 2021-05-20 LAB — PSA, TOTAL AND FREE
PSA, Free Pct: 43.6 %
PSA, Free: 0.48 ng/mL
Prostate Specific Ag, Serum: 1.1 ng/mL (ref 0.0–4.0)

## 2021-05-20 LAB — TSH: TSH: 0.883 u[IU]/mL (ref 0.450–4.500)

## 2021-05-26 ENCOUNTER — Encounter: Payer: Self-pay | Admitting: Family Medicine

## 2021-05-26 DIAGNOSIS — R1011 Right upper quadrant pain: Secondary | ICD-10-CM

## 2021-06-16 DIAGNOSIS — F1010 Alcohol abuse, uncomplicated: Secondary | ICD-10-CM | POA: Diagnosis not present

## 2021-06-16 DIAGNOSIS — Z800 Family history of malignant neoplasm of digestive organs: Secondary | ICD-10-CM | POA: Diagnosis not present

## 2021-06-16 DIAGNOSIS — R1011 Right upper quadrant pain: Secondary | ICD-10-CM | POA: Diagnosis not present

## 2021-06-16 DIAGNOSIS — F17210 Nicotine dependence, cigarettes, uncomplicated: Secondary | ICD-10-CM | POA: Diagnosis not present

## 2021-06-17 IMAGING — CT CT CHEST LUNG CANCER SCREENING LOW DOSE W/O CM
1 of 2 series · 10 of 20 positions shown, 13 images · non-contrast
Comparison: Low-dose lung cancer screening CT chest dated
04/28/2019

CLINICAL DATA: 74-year-old male current smoker, with 34 pack-year
history of smoking, for follow-up lung cancer screening

EXAM:
CT CHEST WITHOUT CONTRAST LOW-DOSE FOR LUNG CANCER SCREENING
TECHNIQUE: Multidetector CT imaging of the chest was performed following the
standard protocol without IV contrast.

[ct lung segmentation data · axial · 0.70mm/px · z∈[-208,-208]mm · 10 of 166 frames shown]
[frame 1/166  mediastinal]
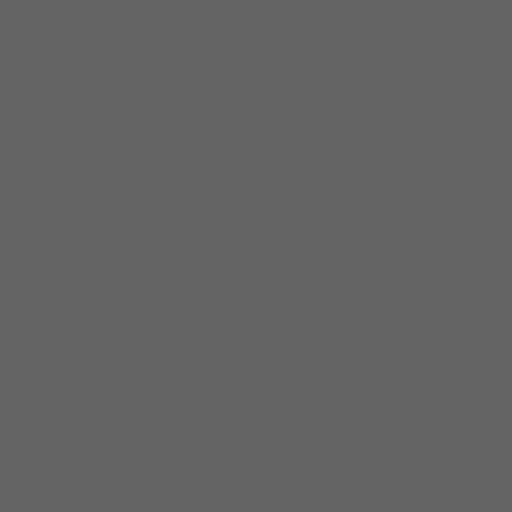
[frame 1/166  lung]
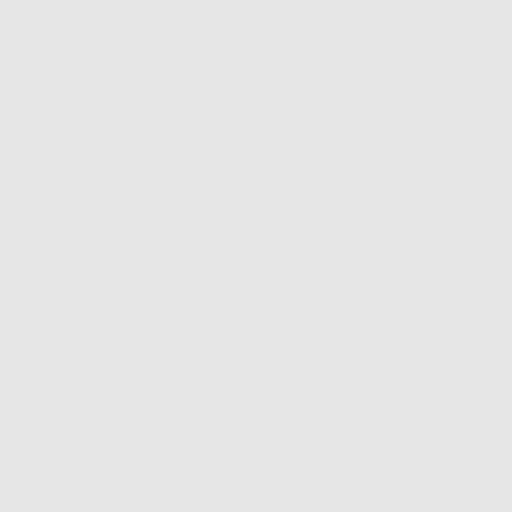
[frame 19/166  lung]
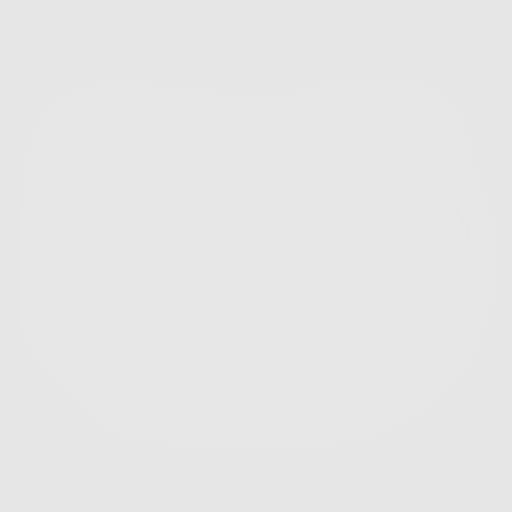
[frame 37/166  lung]
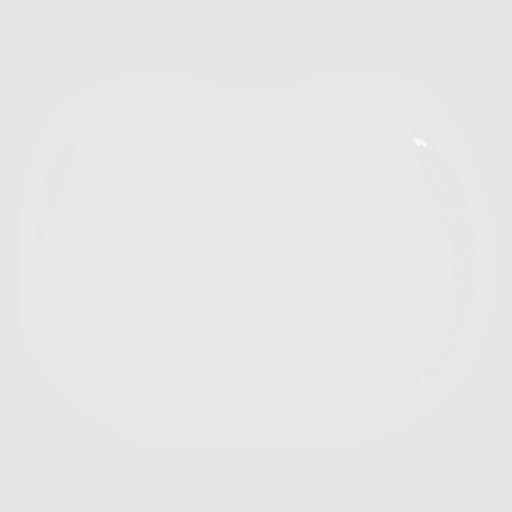
[frame 56/166  lung]
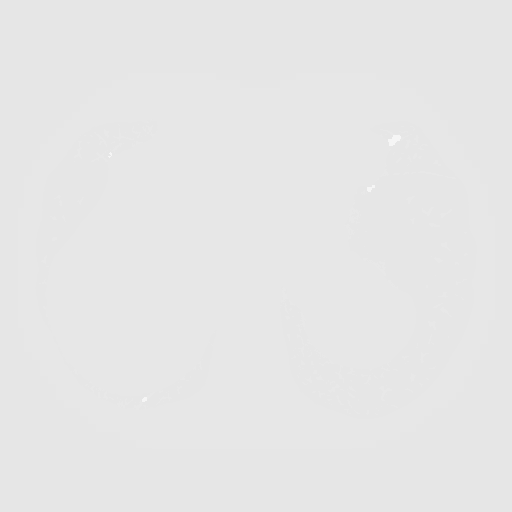
[frame 74/166  mediastinal]
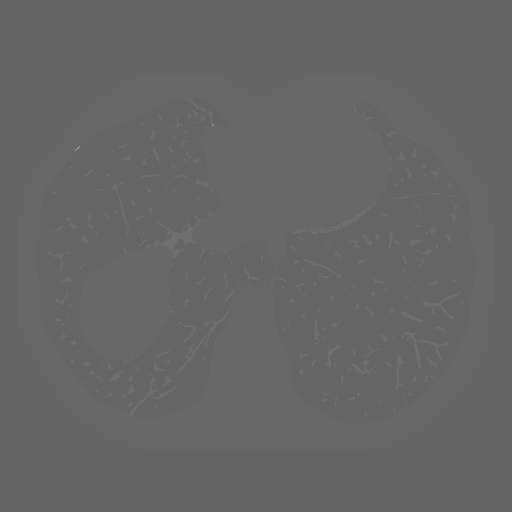
[frame 74/166  lung]
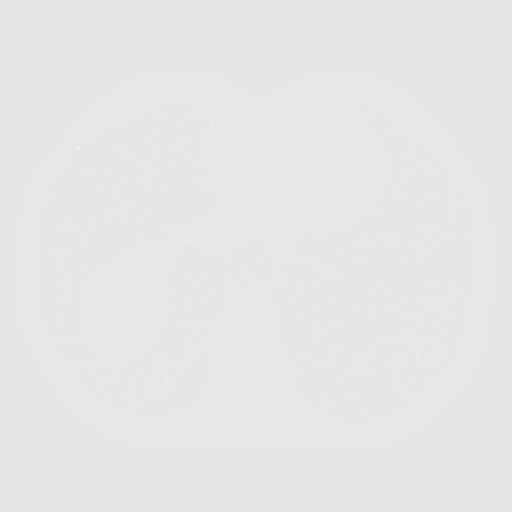
[frame 92/166  lung]
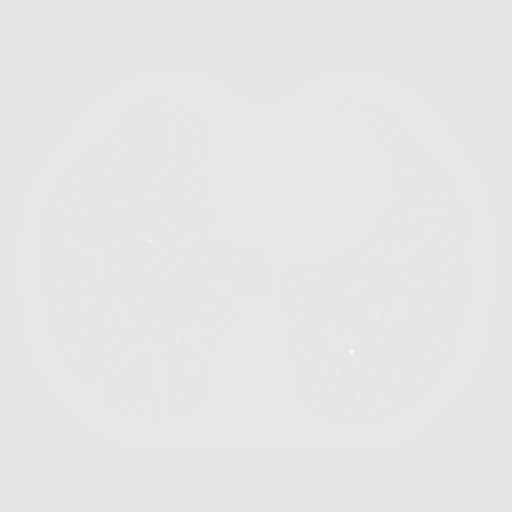
[frame 111/166  lung]
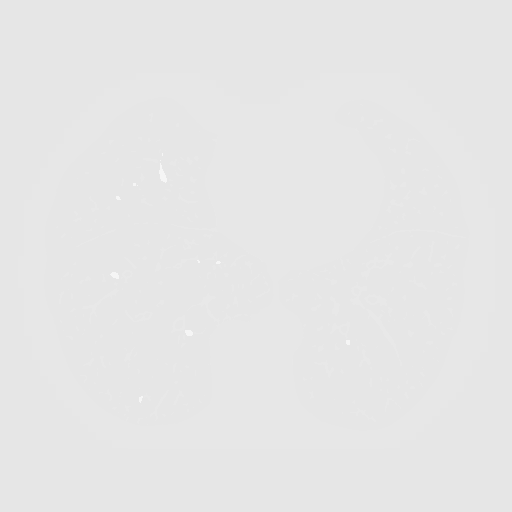
[frame 129/166  lung]
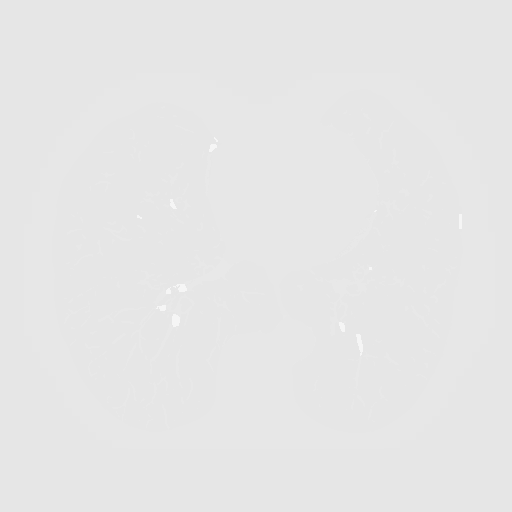
[frame 147/166  mediastinal]
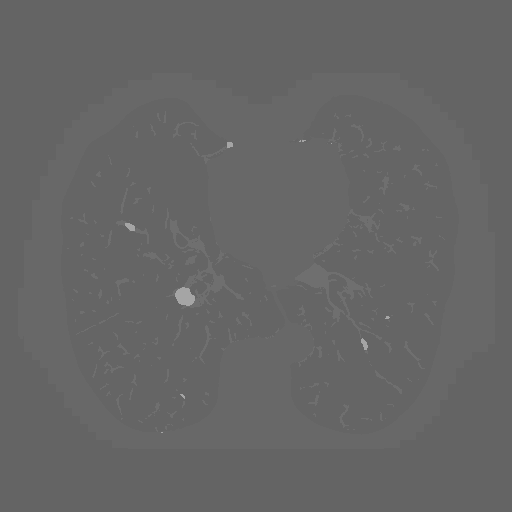
[frame 147/166  lung]
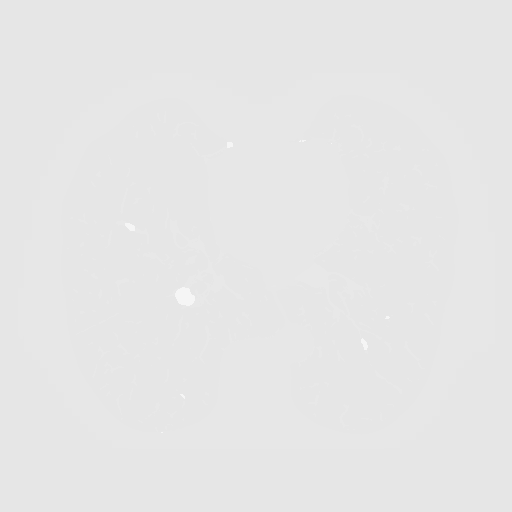
[frame 166/166  lung]
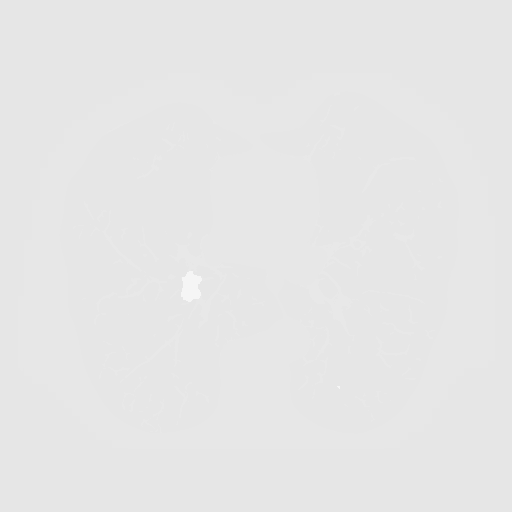

[10 of 20 positions shown; findings below may reference images not displayed]

FINDINGS: Cardiovascular: The heart is normal in size. No pericardial
effusion.

No evidence of thoracic aortic aneurysm. Atherosclerotic
calcifications of the aortic arch.

Three vessel coronary atherosclerosis.

Mediastinum/Nodes: No suspicious mediastinal lymphadenopathy.

Visualized thyroid is unremarkable.

Lungs/Pleura: Evaluation of the lung parenchyma is constrained by
respiratory motion.

Mild biapical pleural-parenchymal scarring.

Moderate centrilobular and paraseptal emphysematous changes, upper
lung predominant.

No focal consolidation.

Small bilateral pulmonary nodules, measuring up to 5.0 mm in the
anterior left lower lobe along the fissure.

No pleural effusion or pneumothorax.

Upper Abdomen: Visualized upper abdomen is notable for benign
hepatic cysts and vascular calcifications.

Musculoskeletal: Degenerative changes of the visualized
thoracolumbar spine.
IMPRESSION: Lung-RADS 2, benign appearance or behavior. Continue annual
screening with low-dose chest CT without contrast in 12 months.

Aortic Atherosclerosis ([id number]) and Emphysema ([id number].K).

## 2021-06-25 ENCOUNTER — Ambulatory Visit: Payer: PPO

## 2021-06-30 ENCOUNTER — Telehealth: Payer: Self-pay | Admitting: Family Medicine

## 2021-06-30 NOTE — Telephone Encounter (Signed)
LVM for pt to rtn my call to schedule AWV with [facility].  

## 2021-07-21 ENCOUNTER — Ambulatory Visit (INDEPENDENT_AMBULATORY_CARE_PROVIDER_SITE_OTHER): Payer: PPO | Admitting: Family Medicine

## 2021-07-21 ENCOUNTER — Other Ambulatory Visit: Payer: Self-pay

## 2021-07-21 ENCOUNTER — Encounter: Payer: Self-pay | Admitting: Family Medicine

## 2021-07-21 VITALS — BP 110/69 | HR 96 | Ht 71.0 in | Wt 153.0 lb

## 2021-07-21 DIAGNOSIS — J439 Emphysema, unspecified: Secondary | ICD-10-CM | POA: Diagnosis not present

## 2021-07-21 DIAGNOSIS — Z23 Encounter for immunization: Secondary | ICD-10-CM | POA: Diagnosis not present

## 2021-07-21 NOTE — Addendum Note (Signed)
Addended by: Alphonzo Dublin on: 07/21/2021 04:00 PM   Modules accepted: Orders

## 2021-07-21 NOTE — Patient Instructions (Signed)
You were given Pneumococcal 20- Prevnar 20 today.

## 2021-07-21 NOTE — Progress Notes (Signed)
BP 110/69   Pulse 96   Ht 5\' 11"  (1.803 m)   Wt 153 lb (69.4 kg)   SpO2 97%   BMI 21.34 kg/m    Subjective:   Patient ID: Spencer Lose., male    DOB: [birth year], 75 y.o.   MRN: [id number]  HPI: Spencer Obenchain. is a 75 y.o. male presenting on 07/21/2021 for Medical Management of Chronic Issues   HPI Emphysema Patient is coming in for emphysema recheck today.  He is currently on no medication.  He has a mild chronic cough but denies any major coughing spells or wheezing spells.  He has 5nighttime symptoms per week and 5daytime symptoms per week currently.  He does not necessarily want medicine and since he is stable.  He is still smoking and does not plan to quit.  Patient had screening done of the life screen and brought results with him today.  The most part everything looks good and his blood work looks good.  Relevant past medical, surgical, family and social history reviewed and updated as indicated. Interim medical history since our last visit reviewed. Allergies and medications reviewed and updated.  Review of Systems  Constitutional:  Negative for chills and fever.  Eyes:  Negative for discharge.  Respiratory:  Negative for shortness of breath and wheezing.   Cardiovascular:  Negative for chest pain and leg swelling.  Musculoskeletal:  Negative for back pain and gait problem.  Skin:  Negative for rash.  All other systems reviewed and are negative.  Per HPI unless specifically indicated above   Allergies as of 07/21/2021   No Known Allergies      Medication List        Accurate as of July 21, 2021  1:37 PM. If you have any questions, ask your nurse or doctor.          cholecalciferol 25 MCG (1000 UNIT) tablet Commonly known as: VITAMIN D3 Take 1,000 Units by mouth daily.   PRESERVISION AREDS 2+MULTI VIT PO Take by mouth.         Objective:   BP 110/69   Pulse 96   Ht 5\' 11"  (1.803 m)   Wt 153 lb (69.4 kg)   SpO2 97%   BMI  21.34 kg/m   Wt Readings from Last 3 Encounters:  07/21/21 153 lb (69.4 kg)  04/24/21 153 lb (69.4 kg)  01/17/21 159 lb (72.1 kg)    Physical Exam Vitals and nursing note reviewed.  Constitutional:      General: He is not in acute distress.    Appearance: He is well-developed. He is not diaphoretic.  Eyes:     General: No scleral icterus.    Conjunctiva/sclera: Conjunctivae normal.  Neck:     Thyroid: No thyromegaly.  Cardiovascular:     Rate and Rhythm: Normal rate and regular rhythm.     Heart sounds: Normal heart sounds. No murmur heard. Pulmonary:     Effort: Pulmonary effort is normal. No respiratory distress.     Breath sounds: Wheezing present. No rhonchi or rales.  Chest:     Chest wall: No tenderness.  Musculoskeletal:        General: Swelling (Trace bilateral lower extremity edema) present. Normal range of motion.     Cervical back: Neck supple.  Lymphadenopathy:     Cervical: No cervical adenopathy.  Skin:    General: Skin is warm and dry.     Findings: No rash.  Neurological:  Mental Status: He is alert and oriented to person, place, and time.     Coordination: Coordination normal.  Psychiatric:        Behavior: Behavior normal.      Assessment & Plan:   Problem List Items Addressed This Visit       Respiratory   Emphysema of lung ([place]) - Primary    Continue current medicine. Follow up plan: Return in about 6 months (around 01/18/2022), or if symptoms worsen or fail to improve, for Physical exam and COPD recheck.  Counseling provided for all of the vaccine components No orders of the defined types were placed in this encounter.   Caryl Pina, MD [facility] 07/21/2021, 1:37 PM

## 2021-07-24 ENCOUNTER — Encounter: Payer: Self-pay | Admitting: Family Medicine

## 2021-08-12 ENCOUNTER — Telehealth: Payer: Self-pay | Admitting: Family Medicine

## 2021-08-12 NOTE — Telephone Encounter (Signed)
Left message for patient to call back and schedule Medicare Annual Wellness Visit (AWV) to be completed by video or phone.   Last AWV: 07/02/2016  Please schedule at anytime with [place]  45 minute appointment  Any questions, please contact me at [phone]

## 2021-09-24 ENCOUNTER — Ambulatory Visit (INDEPENDENT_AMBULATORY_CARE_PROVIDER_SITE_OTHER): Payer: PPO

## 2021-09-24 VITALS — Ht 71.0 in | Wt 156.0 lb

## 2021-09-24 DIAGNOSIS — Z0000 Encounter for general adult medical examination without abnormal findings: Secondary | ICD-10-CM

## 2021-09-24 NOTE — Patient Instructions (Signed)
Spencer Miller , Thank you for taking time to come for your Medicare Wellness Visit. I appreciate your ongoing commitment to your health goals. Please review the following plan we discussed and let me know if I can assist you in the future.   Screening recommendations/referrals: Colonoscopy: Cologuard done 04/20/2019. Repeat every 3 years.  Recommended yearly ophthalmology/optometry visit for glaucoma screening and checkup Recommended yearly dental visit for hygiene and checkup  Vaccinations: Influenza vaccine: Done 06/13/2021 Repeat annually  Pneumococcal vaccine: Done 01/05/2010, 02/26/2015 and 07/21/2021. Tdap vaccine: Done 05/27/2011 Repeat in 10 years  Shingles vaccine: Zoster done 02/06/2011. Shingrix discussed. Please contact your pharmacy for coverage information.     Covid-19: Done 10/23/2019, 11/20/2019 and 07/10/2020  Advanced directives: Advance directive discussed with you today. Even though you declined this today, please call our office should you change your mind, and we can give you the proper paperwork for you to fill out.   Conditions/risks identified: KEEP UP THE GOOD WORK!! If you wish to quit smoking, help is available. For free tobacco cessation program offerings call the [facility] at 506-868-1949 or Live Well Line at 719-192-9117. You may also visit www.[place].com or email livelifewell@[place] .com for more information on other programs.   You may also call 1-800-QUIT-NOW (845)206-8179) or visit www.VirusCrisis.dk or www.BecomeAnEx.org for additional resources on smoking cessation.    Next appointment: Follow up in one year for your annual wellness visit. 2024.  Preventive Care 17 Years and Older, Male  Preventive care refers to lifestyle choices and visits with your health care provider that can promote health and wellness. What does preventive care include? A yearly physical exam. This is also called an annual well check. Dental exams once or  twice a year. Routine eye exams. Ask your health care provider how often you should have your eyes checked. Personal lifestyle choices, including: Daily care of your teeth and gums. Regular physical activity. Eating a healthy diet. Avoiding tobacco and drug use. Limiting alcohol use. Practicing safe sex. Taking low doses of aspirin every day. Taking vitamin and mineral supplements as recommended by your health care provider. What happens during an annual well check? The services and screenings done by your health care provider during your annual well check will depend on your age, overall health, lifestyle risk factors, and family history of disease. Counseling  Your health care provider may ask you questions about your: Alcohol use. Tobacco use. Drug use. Emotional well-being. Home and relationship well-being. Sexual activity. Eating habits. History of falls. Memory and ability to understand (cognition). Work and work Statistician. Screening  You may have the following tests or measurements: Height, weight, and BMI. Blood pressure. Lipid and cholesterol levels. These may be checked every 5 years, or more frequently if you are over 11 years old. Skin check. Lung cancer screening. You may have this screening every year starting at age 27 if you have a 30-pack-year history of smoking and currently smoke or have quit within the past 15 years. Fecal occult blood test (FOBT) of the stool. You may have this test every year starting at age 59. Flexible sigmoidoscopy or colonoscopy. You may have a sigmoidoscopy every 5 years or a colonoscopy every 10 years starting at age 64. Prostate cancer screening. Recommendations will vary depending on your family history and other risks. Hepatitis C blood test. Hepatitis B blood test. Sexually transmitted disease (STD) testing. Diabetes screening. This is done by checking your blood sugar (glucose) after you have not eaten for  a while (fasting).  You may have this done every 1-3 years. Abdominal aortic aneurysm (AAA) screening. You may need this if you are a current or former smoker. Osteoporosis. You may be screened starting at age 36 if you are at high risk. Talk with your health care provider about your test results, treatment options, and if necessary, the need for more tests. Vaccines  Your health care provider may recommend certain vaccines, such as: Influenza vaccine. This is recommended every year. Tetanus, diphtheria, and acellular pertussis (Tdap, Td) vaccine. You may need a Td booster every 10 years. Zoster vaccine. You may need this after age 9. Pneumococcal 13-valent conjugate (PCV13) vaccine. One dose is recommended after age 84. Pneumococcal polysaccharide (PPSV23) vaccine. One dose is recommended after age 93. Talk to your health care provider about which screenings and vaccines you need and how often you need them. This information is not intended to replace advice given to you by your health care provider. Make sure you discuss any questions you have with your health care provider. Document Released: 09/20/2015 Document Revised: 05/13/2016 Document Reviewed: 06/25/2015 Elsevier Interactive Patient Education  2017 [place] Prevention in the Home Falls can cause injuries. They can happen to people of all ages. There are many things you can do to make your home safe and to help prevent falls. What can I do on the outside of my home? Regularly fix the edges of walkways and driveways and fix any cracks. Remove anything that might make you trip as you walk through a door, such as a raised step or threshold. Trim any bushes or trees on the path to your home. Use bright outdoor lighting. Clear any walking paths of anything that might make someone trip, such as rocks or tools. Regularly check to see if handrails are loose or broken. Make sure that both sides of any steps have handrails. Any raised decks and  porches should have guardrails on the edges. Have any leaves, snow, or ice cleared regularly. Use sand or salt on walking paths during winter. Clean up any spills in your garage right away. This includes oil or grease spills. What can I do in the bathroom? Use night lights. Install grab bars by the toilet and in the tub and shower. Do not use towel bars as grab bars. Use non-skid mats or decals in the tub or shower. If you need to sit down in the shower, use a plastic, non-slip stool. Keep the floor dry. Clean up any water that spills on the floor as soon as it happens. Remove soap buildup in the tub or shower regularly. Attach bath mats securely with double-sided non-slip rug tape. Do not have throw rugs and other things on the floor that can make you trip. What can I do in the bedroom? Use night lights. Make sure that you have a light by your bed that is easy to reach. Do not use any sheets or blankets that are too big for your bed. They should not hang down onto the floor. Have a firm chair that has side arms. You can use this for support while you get dressed. Do not have throw rugs and other things on the floor that can make you trip. What can I do in the kitchen? Clean up any spills right away. Avoid walking on wet floors. Keep items that you use a lot in easy-to-reach places. If you need to reach something above you, use a strong step stool that has a  grab bar. Keep electrical cords out of the way. Do not use floor polish or wax that makes floors slippery. If you must use wax, use non-skid floor wax. Do not have throw rugs and other things on the floor that can make you trip. What can I do with my stairs? Do not leave any items on the stairs. Make sure that there are handrails on both sides of the stairs and use them. Fix handrails that are broken or loose. Make sure that handrails are as long as the stairways. Check any carpeting to make sure that it is firmly attached to the  stairs. Fix any carpet that is loose or worn. Avoid having throw rugs at the top or bottom of the stairs. If you do have throw rugs, attach them to the floor with carpet tape. Make sure that you have a light switch at the top of the stairs and the bottom of the stairs. If you do not have them, ask someone to add them for you. What else can I do to help prevent falls? Wear shoes that: Do not have high heels. Have rubber bottoms. Are comfortable and fit you well. Are closed at the toe. Do not wear sandals. If you use a stepladder: Make sure that it is fully opened. Do not climb a closed stepladder. Make sure that both sides of the stepladder are locked into place. Ask someone to hold it for you, if possible. Clearly mark and make sure that you can see: Any grab bars or handrails. First and last steps. Where the edge of each step is. Use tools that help you move around (mobility aids) if they are needed. These include: Canes. Walkers. Scooters. Crutches. Turn on the lights when you go into a dark area. Replace any light bulbs as soon as they burn out. Set up your furniture so you have a clear path. Avoid moving your furniture around. If any of your floors are uneven, fix them. If there are any pets around you, be aware of where they are. Review your medicines with your doctor. Some medicines can make you feel dizzy. This can increase your chance of falling. Ask your doctor what other things that you can do to help prevent falls. This information is not intended to replace advice given to you by your health care provider. Make sure you discuss any questions you have with your health care provider. Document Released: 06/20/2009 Document Revised: 01/30/2016 Document Reviewed: 09/28/2014 Elsevier Interactive Patient Education  2017 [employer].

## 2021-09-24 NOTE — Progress Notes (Signed)
Subjective:   Spencer Woon. is a 76 y.o. male who presents for Medicare Annual/Subsequent preventive examination. Virtual Visit via Telephone Note  I connected with  Spencer Miller. on 09/24/21 at  1:15 PM EST by telephone and verified that I am speaking with the correct person using two identifiers.  Location: Patient: HOME Provider: [facility] Persons participating in the virtual visit: patient/Nurse Health Advisor   I discussed the limitations, risks, security and privacy concerns of performing an evaluation and management service by telephone and the availability of in person appointments. The patient expressed understanding and agreed to proceed.  Interactive audio and video telecommunications were attempted between this nurse and patient, however failed, due to patient having technical difficulties OR patient did not have access to video capability.  We continued and completed visit with audio only.  Some vital signs may be absent or patient reported.   Chriss Driver, LPN  Review of Systems     Cardiac Risk Factors include: advanced age (>84men, >46 women);male gender;sedentary lifestyle;smoking/ tobacco exposure  PHONE VISIT. 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    Objective:    Today's Vitals   09/24/21 1315  Weight: 156 lb (70.8 kg)  Height: 5\' 11"  (1.803 m)   Body mass index is 21.76 kg/m.  Advanced Directives 09/24/2021 07/02/2016 02/13/2015  Does Patient Have a Medical Advance Directive? No No No  Does patient want to make changes to medical advance directive? No - Patient declined - -  Would patient like information on creating a medical advance directive? No - Patient declined Yes - Educational materials given No - patient declined information    Current Medications (verified) Outpatient Encounter Medications as of 09/24/2021  Medication Sig   cholecalciferol (VITAMIN D3) 25 MCG (1000 UNIT) tablet Take 1,000 Units by mouth daily.   FLUAD QUADRIVALENT 0.5 ML  injection    Multiple Vitamins-Minerals (PRESERVISION AREDS 2+MULTI VIT PO) Take by mouth.   No facility-administered encounter medications on file as of 09/24/2021.    Allergies (verified) Patient has no known allergies.   History: Past Medical History:  Diagnosis Date   Cataract    Bilateral    SCCA (squamous cell carcinoma) of skin 05/23/2013   Right Cheek(well diff) (curet and 5FU)   SCCA (squamous cell carcinoma) of skin 11/26/2015   Left Cheek(well diff) (curet and 5FU)   Past Surgical History:  Procedure Laterality Date   CATARACT EXTRACTION, BILATERAL     FRACTURE SURGERY     HERNIA REPAIR     Family History  Problem Relation Age of Onset   Obesity Mother    Melanoma Father    Cancer Brother        stomach   Obesity Sister    Obesity Sister    Obesity Sister    Healthy Son    Healthy Son    Healthy Son    Healthy Son    Social History   Socioeconomic History   Marital status: Married    Spouse name: Olin Hauser   Number of children: 4   Years of education: Not on file   Highest education level: Not on file  Occupational History    Comment: Retired [employer] 25 years    Comment: Retired [employer] 15 years  Tobacco Use   Smoking status: Every Day    Packs/day: 1.00    Years: 40.00    Pack years: 40.00    Types: Cigarettes   Smokeless tobacco: Never  Vaping Use   Vaping Use: Never used  Substance and Sexual Activity   Alcohol use: Yes    Alcohol/week: 14.0 standard drinks    Types: 14 Cans of beer per week   Drug use: No   Sexual activity: Not Currently  Other Topics Concern   Not on file  Social History Narrative   Married x 43 in 2022.   1 grandchild   Social Determinants of Health   Financial Resource Strain: Low Risk    Difficulty of Paying Living Expenses: Not hard at all  Food Insecurity: No Food Insecurity   Worried About Charity fundraiser in the Last Year: Never true   Arboriculturist in the Last Year: Never true   Transportation Needs: No Transportation Needs   Lack of Transportation (Medical): No   Lack of Transportation (Non-Medical): No  Physical Activity: Sufficiently Active   Days of Exercise per Week: 5 days   Minutes of Exercise per Session: 30 min  Stress: No Stress Concern Present   Feeling of Stress : Only a little  Social Connections: Moderately Integrated   Frequency of Communication with Friends and Family: More than three times a week   Frequency of Social Gatherings with Friends and Family: Once a week   Attends Religious Services: 1 to 4 times per year   Active Member of [employer] or Organizations: No   Attends Music therapist: Never   Marital Status: Married    Tobacco Counseling Ready to quit: Not Answered Counseling given: Not Answered   Clinical Intake:  Pre-visit preparation completed: Yes  Pain : No/denies pain     BMI - recorded: 21.76 Nutritional Status: BMI of 19-24  Normal Nutritional Risks: None Diabetes: No  How often do you need to have someone help you when you read instructions, pamphlets, or other written materials from your doctor or pharmacy?: 1 - Never  Diabetic?NO  Interpreter Needed?: No  Information entered by :: MJ Tykel Badie, LPN   Activities of Daily Living In your present state of health, do you have any difficulty performing the following activities: 09/24/2021  Hearing? Y  Vision? N  Difficulty concentrating or making decisions? Y  Comment At times per pt.  Walking or climbing stairs? N  Dressing or bathing? N  Doing errands, shopping? N  Preparing Food and eating ? N  Using the Toilet? N  In the past six months, have you accidently leaked urine? N  Do you have problems with loss of bowel control? N  Managing your Medications? N  Managing your Finances? N  Housekeeping or managing your Housekeeping? N  Some recent data might be hidden    Patient Care Team: Dettinger, Fransisca Kaufmann, MD as PCP - General (Family  Medicine) Ilean China, RN as Registered Nurse Lavonna Monarch, MD as Consulting Physician (Dermatology)  Indicate any recent Medical Services you may have received from other than [facility] providers in the past year (date may be approximate).     Assessment:   This is a routine wellness examination for Spencer Miller.  Hearing/Vision screen Hearing Screening - Comments:: Hearing issues.  Vision Screening - Comments:: Glasses. [place]. 2022.  Dietary issues and exercise activities discussed: Current Exercise Habits: Home exercise routine, Type of exercise: walking, Time (Minutes): 30, Frequency (Times/Week): 5, Weekly Exercise (Minutes/Week): 150, Intensity: Mild, Exercise limited by: respiratory conditions(s)   Goals Addressed             This Visit's Progress  Exercise 3x per week (30 min per time)       Continue to exercise daily and stay healthy.       Depression Screen PHQ 2/9 Scores 09/24/2021 07/21/2021 04/24/2021 01/17/2021 12/23/2020 10/02/2019 03/13/2019  PHQ - 2 Score 0 0 0 0 0 0 0  PHQ- 9 Score - - 0 - - - -    Fall Risk Fall Risk  09/24/2021 07/21/2021 04/24/2021 01/17/2021 12/23/2020  Falls in the past year? 0 0 0 0 0  Number falls in past yr: 0 - - - -  Injury with Fall? 0 - - - -  Risk for fall due to : No Fall Risks - - - -  Follow up Falls prevention discussed - - - -    FALL RISK PREVENTION PERTAINING TO THE HOME:  Any stairs in or around the home? Yes  If so, are there any without handrails? No  Home free of loose throw rugs in walkways, pet beds, electrical cords, etc? Yes  Adequate lighting in your home to reduce risk of falls? Yes   ASSISTIVE DEVICES UTILIZED TO PREVENT FALLS:  Life alert? No  Use of a cane, walker or w/c? No  Grab bars in the bathroom? No  Shower chair or bench in shower? Yes  Elevated toilet seat or a handicapped toilet? Yes   TIMED UP AND GO:  Was the test performed? No .  Phone visit.  Cognitive Function: MMSE - Mini  Mental State Exam 07/02/2016 02/13/2015  Orientation to time 5 5  Orientation to Place 5 5  Registration 3 3  Attention/ Calculation 5 5  Recall 3 2  Language- name 2 objects 2 2  Language- repeat 1 1  Language- follow 3 step command 3 3  Language- read & follow direction 1 1  Write a sentence 1 1  Copy design 1 1  Total score 30 29     6CIT Screen 09/24/2021  What Year? 0 points  What month? 0 points  What time? 0 points  Count back from 20 0 points  Months in reverse 0 points  Repeat phrase 0 points  Total Score 0    Immunizations Immunization History  Administered Date(s) Administered   Fluad Quad(high Dose 65+) 05/10/2019, 06/13/2021   Influenza, High Dose Seasonal PF 05/16/2017, 05/16/2017, 06/15/2018, 06/15/2018   Influenza,inj,Quad PF,6+ Mos 06/19/2013, 06/19/2014, 06/06/2015, 06/02/2016   Influenza-Unspecified 05/16/2017, 06/15/2018, 06/03/2020   Moderna Sars-Covid-2 Vaccination 10/23/2019, 11/20/2019, 08/06/2020   PNEUMOCOCCAL CONJUGATE-20 07/21/2021   Pneumococcal Conjugate-13 02/26/2015   Pneumococcal Polysaccharide-23 01/05/2010   Tdap 05/27/2011   Zoster, Live 02/06/2011    TDAP status: Due, Education has been provided regarding the importance of this vaccine. Advised may receive this vaccine at local pharmacy or Health Dept. Aware to provide a copy of the vaccination record if obtained from local pharmacy or Health Dept. Verbalized acceptance and understanding.  Flu Vaccine status: Up to date  Pneumococcal vaccine status: Up to date  Covid-19 vaccine status: Completed vaccines  Qualifies for Shingles Vaccine? Yes   Zostavax completed Yes   Shingrix Completed?: No.    Education has been provided regarding the importance of this vaccine. Patient has been advised to call insurance company to determine out of pocket expense if they have not yet received this vaccine. Advised may also receive vaccine at local pharmacy or Health Dept. Verbalized acceptance and  understanding.  Screening Tests Health Maintenance  Topic Date Due   Zoster Vaccines- Shingrix (1 of 2) 10/21/2021 (  Originally 02/16/1965)   COVID-19 Vaccine (4 - Booster for Moderna series) 01/19/2022 (Originally 10/01/2020)   TETANUS/TDAP  07/21/2022 (Originally 05/26/2021)   Fecal DNA (Cologuard)  04/03/2022   Pneumonia Vaccine 34+ Years old  Completed   INFLUENZA VACCINE  Completed   Hepatitis C Screening  Completed   HPV VACCINES  Aged Out    Health Maintenance  There are no preventive care reminders to display for this patient.  Colorectal cancer screening: Type of screening: Cologuard. Completed 04/20/2019. Repeat every 3 years  Lung Cancer Screening: (Low Dose CT Chest recommended if Age 37-80 years, 30 pack-year currently smoking OR have quit w/in 15years.) does not qualify.    Additional Screening:  Hepatitis C Screening: does qualify; Completed 03/13/2019  Vision Screening: Recommended annual ophthalmology exams for early detection of glaucoma and other disorders of the eye. Is the patient up to date with their annual eye exam?  Yes  Who is the provider or what is the name of the office in which the patient attends annual eye exams? [facility] If pt is not established with a provider, would they like to be referred to a provider to establish care? No .   Dental Screening: Recommended annual dental exams for proper oral hygiene  Community Resource Referral / Chronic Care Management: CRR required this visit?  No   CCM required this visit?  No      Plan:     I have personally reviewed and noted the following in the patients chart:   Medical and social history Use of alcohol, tobacco or illicit drugs  Current medications and supplements including opioid prescriptions. Patient is not currently taking opioid prescriptions. Functional ability and status Nutritional status Physical activity Advanced directives List of other physicians Hospitalizations,  surgeries, and ER visits in previous 12 months Vitals Screenings to include cognitive, depression, and falls Referrals and appointments  In addition, I have reviewed and discussed with patient certain preventive protocols, quality metrics, and best practice recommendations. A written personalized care plan for preventive services as well as general preventive health recommendations were provided to patient.     Chriss Driver, LPN   11/28/5571   Nurse Notes: Pt is up to date on all age appropriate health maintenance and vaccines. Discussed Shingrix and how to obtain. 6CIT score of 0.

## 2021-11-20 ENCOUNTER — Ambulatory Visit: Payer: PPO

## 2021-11-24 ENCOUNTER — Encounter: Payer: Self-pay | Admitting: Family Medicine

## 2021-12-30 ENCOUNTER — Encounter: Payer: Self-pay | Admitting: Family Medicine

## 2022-01-13 DIAGNOSIS — R051 Acute cough: Secondary | ICD-10-CM | POA: Diagnosis not present

## 2022-01-13 DIAGNOSIS — S80862A Insect bite (nonvenomous), left lower leg, initial encounter: Secondary | ICD-10-CM | POA: Diagnosis not present

## 2022-01-13 DIAGNOSIS — J329 Chronic sinusitis, unspecified: Secondary | ICD-10-CM | POA: Diagnosis not present

## 2022-01-14 ENCOUNTER — Ambulatory Visit: Payer: PPO | Admitting: Family Medicine

## 2022-01-15 ENCOUNTER — Ambulatory Visit (INDEPENDENT_AMBULATORY_CARE_PROVIDER_SITE_OTHER): Payer: PPO | Admitting: Family Medicine

## 2022-01-15 ENCOUNTER — Encounter: Payer: Self-pay | Admitting: Family Medicine

## 2022-01-15 VITALS — BP 91/49 | HR 91 | Temp 98.5°F | Ht 71.0 in | Wt 155.4 lb

## 2022-01-15 DIAGNOSIS — J432 Centrilobular emphysema: Secondary | ICD-10-CM | POA: Diagnosis not present

## 2022-01-15 DIAGNOSIS — J069 Acute upper respiratory infection, unspecified: Secondary | ICD-10-CM

## 2022-01-15 DIAGNOSIS — J0100 Acute maxillary sinusitis, unspecified: Secondary | ICD-10-CM

## 2022-01-15 MED ORDER — AMOXICILLIN-POT CLAVULANATE 875-125 MG PO TABS: 1.0000 | ORAL_TABLET | Freq: Two times a day (BID) | ORAL | 0 refills | Status: DC

## 2022-01-15 MED ORDER — PSEUDOEPHEDRINE-GUAIFENESIN ER 60-600 MG PO TB12: 1.0000 | ORAL_TABLET | Freq: Two times a day (BID) | ORAL | 0 refills | Status: AC

## 2022-01-15 MED ORDER — BETAMETHASONE SOD PHOS & ACET 6 (3-3) MG/ML IJ SUSP: 6.0000 mg | Freq: Once | INTRAMUSCULAR | Status: DC

## 2022-01-15 MED ORDER — ALBUTEROL SULFATE (2.5 MG/3ML) 0.083% IN NEBU: 2.5000 mg | INHALATION_SOLUTION | Freq: Four times a day (QID) | RESPIRATORY_TRACT | 1 refills | Status: DC | PRN

## 2022-01-15 NOTE — Progress Notes (Signed)
? ?Subjective:  ?Patient ID: Spencer Lose., male    DOB: Dec 02, 1945  Age: 76 y.o. MRN: [id number] ? ?CC: Cough, Nasal Congestion, and Headache ? ? ?HPI ?Spencer Lose. presents for coughing cloudy mucous. Onset 4 days ago. Started doxy due to recent tick bite. Prednisone started by urgent care 2 days ago. Cloudy rhinorrhea. Smoker. Has chronic cough that is worse than baseline.  ? ? ?  01/15/2022  ?  2:50 PM 09/24/2021  ?  1:19 PM 07/21/2021  ?  1:02 PM  ?Depression screen PHQ 2/9  ?Decreased Interest 0 0 0  ?Down, Depressed, Hopeless 0 0 0  ?PHQ - 2 Score 0 0 0  ? ? ?History ?Spencer Miller has a past medical history of Cataract, SCCA (squamous cell carcinoma) of skin (05/23/2013), and SCCA (squamous cell carcinoma) of skin (11/26/2015).  ? ?He has a past surgical history that includes Fracture surgery; Hernia repair; and Cataract extraction, bilateral.  ? ?His family history includes Cancer in his brother; Healthy in his son, son, son, and son; Melanoma in his father; Obesity in his mother, sister, sister, and sister.He reports that he has been smoking cigarettes. He has a 40.00 pack-year smoking history. He has never used smokeless tobacco. He reports current alcohol use of about 14.0 standard drinks per week. He reports that he does not use drugs. ? ? ? ?ROS ?Review of Systems ? ?Objective:  ?BP (!) 91/49   Pulse 91   Temp 98.5 ?F (36.9 ?C)   Ht '5\' 11"'$  (1.803 m)   Wt 155 lb 6.4 oz (70.5 kg)   SpO2 93%   BMI 21.67 kg/m?  ? ?BP Readings from Last 3 Encounters:  ?01/15/22 (!) 91/49  ?07/21/21 110/69  ?04/24/21 122/76  ? ? ?Wt Readings from Last 3 Encounters:  ?01/15/22 155 lb 6.4 oz (70.5 kg)  ?09/24/21 156 lb (70.8 kg)  ?07/21/21 153 lb (69.4 kg)  ? ? ? ?Physical Exam ? ? ? ?Assessment & Plan:  ? ?Spencer Miller was seen today for cough, nasal congestion and headache. ? ?Diagnoses and all orders for this visit: ? ?URI with cough and congestion ?-     COVID-19, Flu A+B and RSV ?-     HIV Antibody (routine testing w  rflx) ?-     betamethasone acetate-betamethasone sodium phosphate (CELESTONE) injection 6 mg ? ?Acute maxillary sinusitis, recurrence not specified ?-     HIV Antibody (routine testing w rflx) ?-     betamethasone acetate-betamethasone sodium phosphate (CELESTONE) injection 6 mg ? ?Centrilobular emphysema (HCC) ?-     HIV Antibody (routine testing w rflx) ?-     betamethasone acetate-betamethasone sodium phosphate (CELESTONE) injection 6 mg ? ?Other orders ?-     amoxicillin-clavulanate (AUGMENTIN) 875-125 MG tablet; Take 1 tablet by mouth 2 (two) times daily. Take all of this medication ?-     pseudoephedrine-guaifenesin (MUCINEX D) 60-600 MG 12 hr tablet; Take 1 tablet by mouth every 12 (twelve) hours for 14 days. As needed for congestion ?-     albuterol (PROVENTIL) (2.5 MG/3ML) 0.083% nebulizer solution; Take 3 mLs (2.5 mg total) by nebulization every 6 (six) hours as needed for wheezing or shortness of breath. ? ? ? ? ? ? ?I have discontinued Shayne Alken Jr.'s Multiple Vitamins-Minerals (PRESERVISION AREDS 2+MULTI VIT PO), cholecalciferol, and Fluad Quadrivalent. I am also having him start on amoxicillin-clavulanate, pseudoephedrine-guaifenesin, and albuterol. We will continue to administer betamethasone acetate-betamethasone sodium phosphate. ? ?Allergies as of 01/15/2022   ?  No Known Allergies ?  ? ?  ?Medication List  ?  ? ?  ? Accurate as of Jan 15, 2022  3:14 PM. If you have any questions, ask your nurse or doctor.  ?  ?  ? ?  ? ?STOP taking these medications   ? ?cholecalciferol 25 MCG (1000 UNIT) tablet ?Commonly known as: VITAMIN D3 ?Stopped by: Claretta Fraise, MD ?  ?Fluad Quadrivalent 0.5 ML injection ?Generic drug: influenza vaccine adjuvanted ?Stopped by: Claretta Fraise, MD ?  ?PRESERVISION AREDS 2+MULTI VIT PO ?Stopped by: Claretta Fraise, MD ?  ? ?  ? ?TAKE these medications   ? ?albuterol (2.5 MG/3ML) 0.083% nebulizer solution ?Commonly known as: PROVENTIL ?Take 3 mLs (2.5 mg total) by  nebulization every 6 (six) hours as needed for wheezing or shortness of breath. ?Started by: Claretta Fraise, MD ?  ?amoxicillin-clavulanate 875-125 MG tablet ?Commonly known as: AUGMENTIN ?Take 1 tablet by mouth 2 (two) times daily. Take all of this medication ?Started by: Claretta Fraise, MD ?  ?pseudoephedrine-guaifenesin 60-600 MG 12 hr tablet ?Commonly known as: Mucinex D ?Take 1 tablet by mouth every 12 (twelve) hours for 14 days. As needed for congestion ?Started by: Claretta Fraise, MD ?  ? ?  ? ? ? ?Follow-up: Return if symptoms worsen or fail to improve. ? ?Claretta Fraise, M.D. ?

## 2022-01-16 LAB — COVID-19, FLU A+B AND RSV
Influenza A, NAA: NOT DETECTED
Influenza B, NAA: NOT DETECTED
RSV, NAA: NOT DETECTED
SARS-CoV-2, NAA: NOT DETECTED

## 2022-01-19 NOTE — Progress Notes (Signed)
PATIENT AWARE, VIEWED ON MYCHART ?

## 2022-01-21 ENCOUNTER — Encounter: Payer: Self-pay | Admitting: Family Medicine

## 2022-01-21 ENCOUNTER — Ambulatory Visit (INDEPENDENT_AMBULATORY_CARE_PROVIDER_SITE_OTHER): Payer: PPO | Admitting: Family Medicine

## 2022-01-21 VITALS — BP 121/72 | HR 88 | Ht 71.0 in | Wt 156.0 lb

## 2022-01-21 DIAGNOSIS — Z0001 Encounter for general adult medical examination with abnormal findings: Secondary | ICD-10-CM

## 2022-01-21 DIAGNOSIS — N3943 Post-void dribbling: Secondary | ICD-10-CM

## 2022-01-21 DIAGNOSIS — N401 Enlarged prostate with lower urinary tract symptoms: Secondary | ICD-10-CM | POA: Diagnosis not present

## 2022-01-21 DIAGNOSIS — Z0000 Encounter for general adult medical examination without abnormal findings: Secondary | ICD-10-CM

## 2022-01-21 DIAGNOSIS — Z125 Encounter for screening for malignant neoplasm of prostate: Secondary | ICD-10-CM

## 2022-01-21 DIAGNOSIS — R918 Other nonspecific abnormal finding of lung field: Secondary | ICD-10-CM | POA: Diagnosis not present

## 2022-01-21 DIAGNOSIS — J432 Centrilobular emphysema: Secondary | ICD-10-CM | POA: Diagnosis not present

## 2022-01-21 MED ORDER — ALBUTEROL SULFATE HFA 108 (90 BASE) MCG/ACT IN AERS: 2.0000 | INHALATION_SPRAY | Freq: Four times a day (QID) | RESPIRATORY_TRACT | 0 refills | Status: DC | PRN

## 2022-01-21 MED ORDER — ALBUTEROL SULFATE HFA 108 (90 BASE) MCG/ACT IN AERS: 2.0000 | INHALATION_SPRAY | Freq: Four times a day (QID) | RESPIRATORY_TRACT | 3 refills | Status: DC | PRN

## 2022-01-21 MED ORDER — TAMSULOSIN HCL 0.4 MG PO CAPS: 0.4000 mg | ORAL_CAPSULE | Freq: Every day | ORAL | 3 refills | Status: DC

## 2022-01-21 MED ORDER — PREDNISONE 20 MG PO TABS: ORAL_TABLET | ORAL | 0 refills | Status: DC

## 2022-01-21 NOTE — Progress Notes (Signed)
? ?BP 121/72   Pulse 88   Ht '5\' 11"'$  (1.803 m)   Wt 156 lb (70.8 kg)   SpO2 94%   BMI 21.76 kg/m?   ? ?Subjective:  ? ?Patient ID: Spencer Lose., male    DOB: 11-09-1945, 76 y.o.   MRN: [id number] ? ?HPI: ?Spencer Munyon. is a 76 y.o. male presenting on 01/21/2022 for Medical Management of Chronic Issues (CPE) ? ? ?HPI ?Physical exam ?Patient is coming in today for physical exam and recheck of chronic medical issues.  He does say some urinary frequency due to urinary stream decreased issues.  He does get a little bit of swelling in his left lower extremity at times but it does go down at night.  He would like to do Cologuard.  He is currently recovering from bronchitis and is improved but not completely resolved.  He still has 5 days left of his antibiotic. ? ?BPH ?Patient is coming in for recheck on BPH ?Symptoms: Frequency and hesitancy and decreased stream and nocturia ?Medication: None currently but will try Flomax ?Last PSA: Had a PSA at the [place] just last week but I do not have the records but he thinks it was good. ? ?COPD ?Patient is coming in for COPD recheck today.  Currently has no medicines but he is still getting over a recent bronchitis illness.  He does still smoke over a pack per day although he is trying to quit through the [place] program.  He does has a history of pulmonary nodules and is due for his yearly repeat CT scan. ? ?Relevant past medical, surgical, family and social history reviewed and updated as indicated. Interim medical history since our last visit reviewed. ?Allergies and medications reviewed and updated. ? ?Review of Systems  ?Constitutional:  Negative for chills and fever.  ?HENT:  Positive for congestion.   ?Eyes:  Negative for visual disturbance.  ?Respiratory:  Positive for cough and wheezing. Negative for shortness of breath.   ?Cardiovascular:  Negative for chest pain and leg swelling.  ?Gastrointestinal:  Negative for abdominal pain and anal bleeding.  ?Genitourinary:   Positive for frequency and urgency.  ?Musculoskeletal:  Negative for back pain and gait problem.  ?Skin:  Negative for rash.  ?Neurological:  Negative for dizziness, weakness and light-headedness.  ?All other systems reviewed and are negative. ? ?Per HPI unless specifically indicated above ? ? ?Allergies as of 01/21/2022   ?No Known Allergies ?  ? ?  ?Medication List  ?  ? ?  ? Accurate as of Jan 21, 2022  3:18 PM. If you have any questions, ask your nurse or doctor.  ?  ?  ? ?  ? ?STOP taking these medications   ? ?albuterol (2.5 MG/3ML) 0.083% nebulizer solution ?Commonly known as: PROVENTIL ?Replaced by: albuterol 108 (90 Base) MCG/ACT inhaler ?Stopped by: Worthy Rancher, MD ?  ? ?  ? ?TAKE these medications   ? ?albuterol 108 (90 Base) MCG/ACT inhaler ?Commonly known as: VENTOLIN HFA ?Inhale 2 puffs into the lungs every 6 (six) hours as needed for wheezing or shortness of breath. ?Replaces: albuterol (2.5 MG/3ML) 0.083% nebulizer solution ?Started by: Worthy Rancher, MD ?  ?albuterol 108 (90 Base) MCG/ACT inhaler ?Commonly known as: VENTOLIN HFA ?Inhale 2 puffs into the lungs every 6 (six) hours as needed for wheezing or shortness of breath. ?Started by: Worthy Rancher, MD ?  ?amoxicillin-clavulanate 875-125 MG tablet ?Commonly known as: AUGMENTIN ?Take 1 tablet by  mouth 2 (two) times daily. Take all of this medication ?  ?predniSONE 20 MG tablet ?Commonly known as: DELTASONE ?2 po at same time daily for 5 days ?Started by: Worthy Rancher, MD ?  ?pseudoephedrine-guaifenesin 60-600 MG 12 hr tablet ?Commonly known as: Mucinex D ?Take 1 tablet by mouth every 12 (twelve) hours for 14 days. As needed for congestion ?  ?tamsulosin 0.4 MG Caps capsule ?Commonly known as: FLOMAX ?Take 1 capsule (0.4 mg total) by mouth daily. ?Started by: Worthy Rancher, MD ?  ? ?  ? ? ? ?Objective:  ? ?BP 121/72   Pulse 88   Ht '5\' 11"'$  (1.803 m)   Wt 156 lb (70.8 kg)   SpO2 94%   BMI 21.76 kg/m?   ?Wt Readings  from Last 3 Encounters:  ?01/21/22 156 lb (70.8 kg)  ?01/15/22 155 lb 6.4 oz (70.5 kg)  ?09/24/21 156 lb (70.8 kg)  ?  ?Physical Exam ?Vitals and nursing note reviewed.  ?Constitutional:   ?   General: He is not in acute distress. ?   Appearance: He is well-developed. He is not diaphoretic.  ?Eyes:  ?   General: No scleral icterus. ?   Conjunctiva/sclera: Conjunctivae normal.  ?Neck:  ?   Thyroid: No thyromegaly.  ?Cardiovascular:  ?   Rate and Rhythm: Normal rate and regular rhythm.  ?   Heart sounds: Normal heart sounds. No murmur heard. ?Pulmonary:  ?   Effort: Pulmonary effort is normal. No respiratory distress.  ?   Breath sounds: Wheezing (End expiratory wheezes) present. No rhonchi or rales.  ?Musculoskeletal:     ?   General: Normal range of motion.  ?   Cervical back: Neck supple.  ?Lymphadenopathy:  ?   Cervical: No cervical adenopathy.  ?Skin: ?   General: Skin is warm and dry.  ?   Findings: No rash.  ?Neurological:  ?   Mental Status: He is alert and oriented to person, place, and time.  ?   Coordination: Coordination normal.  ?Psychiatric:     ?   Behavior: Behavior normal.  ? ? ? ? ?Assessment & Plan:  ? ?Problem List Items Addressed This Visit   ? ?  ? Respiratory  ? Emphysema of lung ([place])  ? Relevant Medications  ? predniSONE (DELTASONE) 20 MG tablet  ? albuterol (VENTOLIN HFA) 108 (90 Base) MCG/ACT inhaler  ? albuterol (VENTOLIN HFA) 108 (90 Base) MCG/ACT inhaler  ? Other Relevant Orders  ? CT CHEST NODULE FOLLOW UP LOW DOSE W/O  ? Pulmonary nodules  ? Relevant Orders  ? CT CHEST NODULE FOLLOW UP LOW DOSE W/O  ?  ? Genitourinary  ? BPH (benign prostatic hyperplasia)  ? Relevant Medications  ? tamsulosin (FLOMAX) 0.4 MG CAPS capsule  ? ?Other Visit Diagnoses   ? ? Physical exam    -  Primary  ? Relevant Medications  ? predniSONE (DELTASONE) 20 MG tablet  ? Prostate cancer screening      ? Relevant Orders  ? Cologuard  ? ?  ?  ?Encouraged a maintenance inhaler but only wants his albuterol right now,  will send a short course of prednisone with him.  We will start Flomax for him to help with his urinary symptoms. ? ?We will send Cologuard for him.  Patient refuses colonoscopy so at least we can try for the Cologuard. ?Follow up plan: ?Return in about 6 months (around 07/24/2022), or if symptoms worsen or fail to improve, for Recheck  COPD and emphysema. ? ?Counseling provided for all of the vaccine components ?Orders Placed This Encounter  ?Procedures  ? CT CHEST NODULE FOLLOW UP LOW DOSE W/O  ? Cologuard  ? ? ?Caryl Pina, MD ?[place] ?01/21/2022, 3:18 PM ? ? ?  ?

## 2022-03-02 ENCOUNTER — Ambulatory Visit (HOSPITAL_COMMUNITY): Admission: RE | Admit: 2022-03-02 | Payer: PPO | Source: Ambulatory Visit

## 2022-03-11 ENCOUNTER — Ambulatory Visit: Payer: PPO | Admitting: Dermatology

## 2022-03-25 ENCOUNTER — Ambulatory Visit: Payer: PPO | Admitting: Dermatology

## 2022-04-01 ENCOUNTER — Other Ambulatory Visit: Payer: Self-pay | Admitting: Family Medicine

## 2022-04-01 ENCOUNTER — Ambulatory Visit (HOSPITAL_COMMUNITY)
Admission: RE | Admit: 2022-04-01 | Discharge: 2022-04-01 | Disposition: A | Discharge status: Home / Self Care | Payer: PPO | Source: Ambulatory Visit | Attending: Family Medicine | Admitting: Family Medicine

## 2022-04-01 DIAGNOSIS — I700 Atherosclerosis of aorta: Secondary | ICD-10-CM | POA: Diagnosis not present

## 2022-04-01 DIAGNOSIS — J432 Centrilobular emphysema: Secondary | ICD-10-CM | POA: Diagnosis not present

## 2022-04-01 DIAGNOSIS — R918 Other nonspecific abnormal finding of lung field: Secondary | ICD-10-CM | POA: Insufficient documentation

## 2022-04-01 DIAGNOSIS — Z122 Encounter for screening for malignant neoplasm of respiratory organs: Secondary | ICD-10-CM | POA: Insufficient documentation

## 2022-04-01 DIAGNOSIS — I2510 Atherosclerotic heart disease of native coronary artery without angina pectoris: Secondary | ICD-10-CM | POA: Diagnosis not present

## 2022-04-01 DIAGNOSIS — F17210 Nicotine dependence, cigarettes, uncomplicated: Secondary | ICD-10-CM | POA: Insufficient documentation

## 2022-04-09 ENCOUNTER — Encounter: Payer: Self-pay | Admitting: Family Medicine

## 2022-04-09 DIAGNOSIS — Z1211 Encounter for screening for malignant neoplasm of colon: Secondary | ICD-10-CM

## 2022-04-16 ENCOUNTER — Ambulatory Visit: Payer: Self-pay | Admitting: *Deleted

## 2022-04-16 NOTE — Chronic Care Management (AMB) (Signed)
  Chronic Care Management   Note  04/16/2022 Name: Spencer Miller. MRN: [id number] DOB: Nov 27, 1945   Due to changes in the Chronic Care Management program, I am removing myself as the [place] from the Care Team and closing any RN Care Management Care Plans. The patient has not worked with the Consulting civil engineer within the past 6 months. Patient was not scheduled to be followed by the RN Care Coordination nurse for [facility].   Patient does not have an open Care Plan with another CCM team member. Patient does not have a current CCM referral placed since 01/05/22. CCM enrollment status changed to "not enrolled".   Patient's PCP can place a new referral if the they needs Care Management or Care Coordination services in the future.  Chong Sicilian, BSN, RN-BC Proofreader Dial: [phone]

## 2022-04-16 NOTE — Patient Instructions (Signed)
Spencer Miller.  At some point during the past 4 years, I have worked with you through the Chronic Care Management Program (CCM) at [facility]. We have not worked together within the past 6 months.   Due to program changes I am removing myself from your care team.   If you are currently active with another CCM Team Member, you will remain active with them unless they reach out to you with additional information.   If you feel that you need services in the future,  please talk with your primary care provider and request a new referral for Care Management or Care Coordination services. This does not affect your status as a patient at [facility].   Thank you for allowing me to participate in your your healthcare journey.  Chong Sicilian, BSN, RN-BC Proofreader Dial: [phone]

## 2022-04-22 DIAGNOSIS — Z1211 Encounter for screening for malignant neoplasm of colon: Secondary | ICD-10-CM | POA: Diagnosis not present

## 2022-05-01 LAB — COLOGUARD: COLOGUARD: NEGATIVE

## 2022-05-14 DIAGNOSIS — C44719 Basal cell carcinoma of skin of left lower limb, including hip: Secondary | ICD-10-CM | POA: Diagnosis not present

## 2022-05-14 DIAGNOSIS — D225 Melanocytic nevi of trunk: Secondary | ICD-10-CM | POA: Diagnosis not present

## 2022-05-14 DIAGNOSIS — Z1283 Encounter for screening for malignant neoplasm of skin: Secondary | ICD-10-CM | POA: Diagnosis not present

## 2022-09-08 ENCOUNTER — Encounter: Payer: Self-pay | Admitting: Family Medicine

## 2022-09-08 DIAGNOSIS — Z1211 Encounter for screening for malignant neoplasm of colon: Secondary | ICD-10-CM

## 2022-09-21 ENCOUNTER — Telehealth (INDEPENDENT_AMBULATORY_CARE_PROVIDER_SITE_OTHER): Payer: PPO | Admitting: Nurse Practitioner

## 2022-09-21 ENCOUNTER — Encounter: Payer: Self-pay | Admitting: Nurse Practitioner

## 2022-09-21 DIAGNOSIS — J069 Acute upper respiratory infection, unspecified: Secondary | ICD-10-CM

## 2022-09-21 MED ORDER — FLUTICASONE PROPIONATE 50 MCG/ACT NA SUSP: 2.0000 | Freq: Every day | NASAL | 6 refills | Status: DC

## 2022-09-21 NOTE — Patient Instructions (Signed)
1. Take meds as prescribed 2. Use a cool mist humidifier especially during the winter months and when heat has been humid. 3. Use saline nose sprays frequently 4. Saline irrigations of the nose can be very helpful if done frequently.  * 4X daily for 1 week*  * Use of a nettie pot can be helpful with this. Follow directions with this* 5. Drink plenty of fluids 6. Keep thermostat turn down low 7.For any cough or congestion- mucinex d 8. For fever or aces or pains- take tylenol or ibuprofen appropriate for age and weight.  * for fevers greater than 101 orally you may alternate ibuprofen and tylenol every  3 hours.    

## 2022-09-21 NOTE — Progress Notes (Signed)
Virtual Visit Consent   Spencer Lose., you are scheduled for a virtual visit with Mary-Margaret Hassell Done, [place], a [facility] provider, today.     Just as with appointments in the office, your consent must be obtained to participate.  Your consent will be active for this visit and any virtual visit you may have with one of our providers in the next 365 days.     If you have a MyChart account, a copy of this consent can be sent to you electronically.  All virtual visits are billed to your insurance company just like a traditional visit in the office.    As this is a virtual visit, video technology does not allow for your provider to perform a traditional examination.  This may limit your provider's ability to fully assess your condition.  If your provider identifies any concerns that need to be evaluated in person or the need to arrange testing (such as labs, EKG, etc.), we will make arrangements to do so.     Although advances in technology are sophisticated, we cannot ensure that it will always work on either your end or our end.  If the connection with a video visit is poor, the visit may have to be switched to a telephone visit.  With either a video or telephone visit, we are not always able to ensure that we have a secure connection.     I need to obtain your verbal consent now.   Are you willing to proceed with your visit today? YES   Spencer Miller. has provided verbal consent on 09/21/2022 for a virtual visit (video or telephone).   Mary-Margaret Hassell Done, FNP   Date: 09/21/2022 10:07 AM   Virtual Visit via Video Note   I, Mary-Margaret Hassell Done, connected with Spencer Miller. ([id number], [birth year]) on 09/21/22 at  5:30 PM EST by a video-enabled telemedicine application and verified that I am speaking with the correct person using two identifiers.  Location: Patient: Virtual Visit Location Patient: Home Provider: Virtual Visit Location Provider: Mobile   I discussed the  limitations of evaluation and management by telemedicine and the availability of in person appointments. The patient expressed understanding and agreed to proceed.    History of Present Illness: Spencer Miller. is a 77 y.o. who identifies as a male who was assigned male at birth, and is being seen today for cough .  HPI: URI  This is a new problem. The current episode started in the past 7 days. The problem has been waxing and waning. There has been no fever. Associated symptoms include congestion and rhinorrhea. Pertinent negatives include no coughing. Treatments tried: mucinex d. The treatment provided mild relief.    Review of Systems  HENT:  Positive for congestion and rhinorrhea.   Respiratory:  Negative for cough.     Problems:  Patient Active Problem List   Diagnosis Date Noted   Smokes less than 1 pack a day with greater than 40 pack year history 03/13/2019   Fatigue associated with anemia 01/10/2019   Aortic atherosclerosis ([place]) 07/28/2018   Emphysema of lung ([place]) 07/28/2018   Ascending aorta dilatation ([place]) 03/17/2018   Pulmonary nodules 03/17/2018   BPH (benign prostatic hyperplasia) 07/13/2016   Smoking addiction 03/01/2014   Hx of traumatic brain injury 03/01/2014    Allergies: No Known Allergies Medications:  Current Outpatient Medications:    albuterol (VENTOLIN HFA) 108 (90 Base) MCG/ACT inhaler, Inhale 2 puffs into the  lungs every 6 (six) hours as needed for wheezing or shortness of breath., Disp: 8 g, Rfl: 3   albuterol (VENTOLIN HFA) 108 (90 Base) MCG/ACT inhaler, Inhale 2 puffs into the lungs every 6 (six) hours as needed for wheezing or shortness of breath., Disp: 8 g, Rfl: 0   amoxicillin-clavulanate (AUGMENTIN) 875-125 MG tablet, Take 1 tablet by mouth 2 (two) times daily. Take all of this medication, Disp: 20 tablet, Rfl: 0   predniSONE (DELTASONE) 20 MG tablet, 2 po at same time daily for 5 days, Disp: 10 tablet, Rfl: 0   tamsulosin (FLOMAX) 0.4 MG  CAPS capsule, Take 1 capsule (0.4 mg total) by mouth daily., Disp: 30 capsule, Rfl: 3  Observations/Objective: Patient is well-developed, well-nourished in no acute distress.  Resting comfortably  at home.  Head is normocephalic, atraumatic.  No labored breathing.  Speech is clear and coherent with logical content.  Patient is alert and oriented at baseline.  Raspy voice No cough  Assessment and Plan:  Spencer Lose. in today with chief complaint of No chief complaint on file.   1. URI with cough and congestion 1. Take meds as prescribed 2. Use a cool mist humidifier especially during the winter months and when heat has been humid. 3. Use saline nose sprays frequently 4. Saline irrigations of the nose can be very helpful if done frequently.  * 4X daily for 1 week*  * Use of a nettie pot can be helpful with this. Follow directions with this* 5. Drink plenty of fluids 6. Keep thermostat turn down low 7.For any cough or congestion- mucinex D 8. For fever or aces or pains- take tylenol or ibuprofen appropriate for age and weight.  * for fevers greater than 101 orally you may alternate ibuprofen and tylenol every  3 hours.        Follow Up Instructions: I discussed the assessment and treatment plan with the patient. The patient was provided an opportunity to ask questions and all were answered. The patient agreed with the plan and demonstrated an understanding of the instructions.  A copy of instructions were sent to the patient via MyChart.  The patient was advised to call back or seek an in-person evaluation if the symptoms worsen or if the condition fails to improve as anticipated.  Time:  I spent 6 minutes with the patient via telehealth technology discussing the above problems/concerns.    Mary-Margaret Hassell Done, FNP

## 2022-10-03 DIAGNOSIS — U071 COVID-19: Secondary | ICD-10-CM | POA: Diagnosis not present

## 2022-10-03 DIAGNOSIS — J029 Acute pharyngitis, unspecified: Secondary | ICD-10-CM | POA: Diagnosis not present

## 2022-10-27 ENCOUNTER — Telehealth: Payer: Self-pay | Admitting: Family Medicine

## 2022-10-27 NOTE — Telephone Encounter (Signed)
Patient declined AWV per cancellation note  (I am feeling unusally healthy. No need for wellness visits before January 2028.)

## 2022-10-29 ENCOUNTER — Encounter: Payer: Self-pay | Admitting: Family Medicine

## 2022-10-29 ENCOUNTER — Ambulatory Visit (INDEPENDENT_AMBULATORY_CARE_PROVIDER_SITE_OTHER): Payer: PPO | Admitting: Family Medicine

## 2022-10-29 VITALS — BP 122/75 | HR 96 | Ht 71.0 in | Wt 166.0 lb

## 2022-10-29 DIAGNOSIS — Z13220 Encounter for screening for lipoid disorders: Secondary | ICD-10-CM | POA: Diagnosis not present

## 2022-10-29 DIAGNOSIS — N401 Enlarged prostate with lower urinary tract symptoms: Secondary | ICD-10-CM

## 2022-10-29 DIAGNOSIS — J432 Centrilobular emphysema: Secondary | ICD-10-CM

## 2022-10-29 DIAGNOSIS — J449 Chronic obstructive pulmonary disease, unspecified: Secondary | ICD-10-CM | POA: Diagnosis not present

## 2022-10-29 DIAGNOSIS — R5383 Other fatigue: Secondary | ICD-10-CM

## 2022-10-29 DIAGNOSIS — N3943 Post-void dribbling: Secondary | ICD-10-CM

## 2022-10-29 DIAGNOSIS — R600 Localized edema: Secondary | ICD-10-CM | POA: Insufficient documentation

## 2022-10-29 DIAGNOSIS — R609 Edema, unspecified: Secondary | ICD-10-CM | POA: Diagnosis not present

## 2022-10-29 NOTE — Progress Notes (Signed)
BP 122/75   Pulse 96   Ht 5' 11"$  (1.803 m)   Wt 166 lb (75.3 kg)   SpO2 99%   BMI 23.15 kg/m    Subjective:   Patient ID: Spencer Miller., male    DOB: October 21, 1945, 77 y.o.   MRN: [id number]  HPI: Spencer Miller. is a 77 y.o. male presenting on 10/29/2022 for Edema (BLE)   HPI COPD Patient is coming in for COPD recheck today.  He is currently on no medication.  He has a mild chronic cough but denies any major coughing spells or wheezing spells.  He has 2 nighttime symptoms per week and 3 daytime symptoms per week currently.  His biggest complaint is fatigue and decreased energy.  He says he has been struggling with that.  Peripheral edema Patient is coming in today with complaints of peripheral edema.  He says the swelling has been more problematic over the past year but he does admit he has not really seen a physician in over a year.  He says the swelling is slightly worse in his left leg than his right.  He used to wear compression stockings but then had his surgical procedure with dermatology and wound healing stopped wearing the compression stockings.  He does admit that he has been more sedentary and not moving as much does not keep his feet propped up either.  BPH Patient is coming in for recheck on BPH Symptoms: Nocturia, 1 times per night Medication: None currently, never tried Flomax Last PSA: Few years ago  Relevant past medical, surgical, family and social history reviewed and updated as indicated. Interim medical history since our last visit reviewed. Allergies and medications reviewed and updated.  Review of Systems  Constitutional:  Negative for chills and fever.  Eyes:  Negative for visual disturbance.  Respiratory:  Positive for cough, shortness of breath and wheezing.   Cardiovascular:  Positive for leg swelling. Negative for chest pain and palpitations.  Musculoskeletal:  Negative for back pain and gait problem.  Skin:  Negative for rash.   Neurological:  Negative for dizziness and light-headedness.  All other systems reviewed and are negative.   Per HPI unless specifically indicated above   Allergies as of 10/29/2022   No Known Allergies      Medication List        Accurate as of October 29, 2022  2:51 PM. If you have any questions, ask your nurse or doctor.          STOP taking these medications    albuterol 108 (90 Base) MCG/ACT inhaler Commonly known as: VENTOLIN HFA Stopped by: Fransisca Kaufmann Orvill Coulthard, MD   amoxicillin-clavulanate 875-125 MG tablet Commonly known as: AUGMENTIN Stopped by: Fransisca Kaufmann Idrissa Beville, MD   fluticasone 50 MCG/ACT nasal spray Commonly known as: FLONASE Stopped by: Fransisca Kaufmann Natausha Jungwirth, MD   predniSONE 20 MG tablet Commonly known as: DELTASONE Stopped by: Fransisca Kaufmann Arisbel Maione, MD   tamsulosin 0.4 MG Caps capsule Commonly known as: FLOMAX Stopped by: Fransisca Kaufmann Juna Caban, MD         Objective:   BP 122/75   Pulse 96   Ht 5' 11"$  (1.803 m)   Wt 166 lb (75.3 kg)   SpO2 99%   BMI 23.15 kg/m   Wt Readings from Last 3 Encounters:  10/29/22 166 lb (75.3 kg)  01/21/22 156 lb (70.8 kg)  01/15/22 155 lb 6.4 oz (70.5 kg)    Physical Exam Vitals and nursing  note reviewed.  Constitutional:      General: He is not in acute distress.    Appearance: He is well-developed. He is not diaphoretic.  Eyes:     General: No scleral icterus.    Conjunctiva/sclera: Conjunctivae normal.  Neck:     Thyroid: No thyromegaly.  Cardiovascular:     Rate and Rhythm: Normal rate and regular rhythm.     Heart sounds: Normal heart sounds. No murmur heard. Pulmonary:     Effort: Pulmonary effort is normal. No respiratory distress.     Breath sounds: Normal breath sounds. No wheezing.  Musculoskeletal:        General: Swelling (2+ pitting edema) present. No tenderness. Normal range of motion.     Cervical back: Neck supple.  Lymphadenopathy:     Cervical: No cervical adenopathy.  Skin:     General: Skin is warm and dry.     Findings: No rash.  Neurological:     Mental Status: He is alert and oriented to person, place, and time.     Coordination: Coordination normal.  Psychiatric:        Behavior: Behavior normal.       Assessment & Plan:   Problem List Items Addressed This Visit       Respiratory   Emphysema of lung ([place])   Relevant Orders   CBC with Differential/Platelet   CMP14+EGFR     Genitourinary   BPH (benign prostatic hyperplasia)   Relevant Orders   PSA, total and free     Other   Peripheral edema   Other Visit Diagnoses     Chronic obstructive pulmonary disease, unspecified COPD type ([place])    -  Primary   Relevant Orders   CBC with Differential/Platelet   Other fatigue       Relevant Orders   CBC with Differential/Platelet   CMP14+EGFR   Lipid panel   TSH   Lipid screening       Relevant Orders   Lipid panel     Gave sample for Breztri and also recommended to start using his compression stockings again.  Will check blood work  Follow up plan: Return in about 6 months (around 04/29/2023), or if symptoms worsen or fail to improve, for Edema and fatigue and COPD recheck.  Counseling provided for all of the vaccine components Orders Placed This Encounter  Procedures   CBC with Differential/Platelet   CMP14+EGFR   Lipid panel   TSH   PSA, total and free    Caryl Pina, MD [facility] 10/29/2022, 2:51 PM

## 2022-10-30 LAB — CBC WITH DIFFERENTIAL/PLATELET
Basophils Absolute: 0.1 10*3/uL (ref 0.0–0.2)
Basos: 1 %
EOS (ABSOLUTE): 0.2 10*3/uL (ref 0.0–0.4)
Eos: 3 %
Hematocrit: 37.8 % (ref 37.5–51.0)
Hemoglobin: 13.3 g/dL (ref 13.0–17.7)
Immature Grans (Abs): 0 10*3/uL (ref 0.0–0.1)
Immature Granulocytes: 0 %
Lymphocytes Absolute: 2.8 10*3/uL (ref 0.7–3.1)
Lymphs: 38 %
MCH: 33.3 pg — ABNORMAL HIGH (ref 26.6–33.0)
MCHC: 35.2 g/dL (ref 31.5–35.7)
MCV: 95 fL (ref 79–97)
Monocytes Absolute: 0.6 10*3/uL (ref 0.1–0.9)
Monocytes: 8 %
Neutrophils Absolute: 3.8 10*3/uL (ref 1.4–7.0)
Neutrophils: 50 %
Platelets: 287 10*3/uL (ref 150–450)
RBC: 3.99 x10E6/uL — ABNORMAL LOW (ref 4.14–5.80)
RDW: 12.5 % (ref 11.6–15.4)
WBC: 7.5 10*3/uL (ref 3.4–10.8)

## 2022-10-30 LAB — LIPID PANEL
Chol/HDL Ratio: 2.8 ratio (ref 0.0–5.0)
Cholesterol, Total: 174 mg/dL (ref 100–199)
HDL: 62 mg/dL (ref >39)
LDL Chol Calc (NIH): 95 mg/dL (ref 0–99)
Triglycerides: 94 mg/dL (ref 0–149)
VLDL Cholesterol Cal: 17 mg/dL (ref 5–40)

## 2022-10-30 LAB — CMP14+EGFR
ALT: 11 IU/L (ref 0–44)
AST: 13 IU/L (ref 0–40)
Albumin/Globulin Ratio: 1.6 (ref 1.2–2.2)
Albumin: 3.9 g/dL (ref 3.8–4.8)
Alkaline Phosphatase: 84 IU/L (ref 44–121)
BUN/Creatinine Ratio: 13 (ref 10–24)
BUN: 11 mg/dL (ref 8–27)
Bilirubin Total: 0.3 mg/dL (ref 0.0–1.2)
CO2: 23 mmol/L (ref 20–29)
Calcium: 9.1 mg/dL (ref 8.6–10.2)
Chloride: 103 mmol/L (ref 96–106)
Creatinine, Ser: 0.83 mg/dL (ref 0.76–1.27)
Globulin, Total: 2.4 g/dL (ref 1.5–4.5)
Glucose: 80 mg/dL (ref 70–99)
Potassium: 4.4 mmol/L (ref 3.5–5.2)
Sodium: 139 mmol/L (ref 134–144)
Total Protein: 6.3 g/dL (ref 6.0–8.5)
eGFR: 91 mL/min/{1.73_m2} (ref >59)

## 2022-10-30 LAB — PSA, TOTAL AND FREE
PSA, Free Pct: 39.2 %
PSA, Free: 0.47 ng/mL
Prostate Specific Ag, Serum: 1.2 ng/mL (ref 0.0–4.0)

## 2022-10-30 LAB — TSH: TSH: 0.936 u[IU]/mL (ref 0.450–4.500)

## 2022-11-27 ENCOUNTER — Telehealth: Payer: Self-pay | Admitting: Family Medicine

## 2022-11-27 NOTE — Telephone Encounter (Signed)
Contacted [name] to schedule their annual wellness visit. Patient declined to schedule AWV at this time.  Per 09/21/21 cancellation note  (I am feeling unusally healthy. No need for wellness visits before January 2028   Thank you,  Colletta Maryland,  [facility] Direct Dial ??[phone]

## 2023-03-17 ENCOUNTER — Encounter: Payer: Self-pay | Admitting: Family Medicine

## 2023-03-17 MED ORDER — AMOXICILLIN 500 MG PO CAPS: 500.0000 mg | ORAL_CAPSULE | Freq: Two times a day (BID) | ORAL | 0 refills | Status: DC

## 2023-04-01 ENCOUNTER — Ambulatory Visit: Payer: PPO | Admitting: Nurse Practitioner

## 2023-04-29 ENCOUNTER — Ambulatory Visit (INDEPENDENT_AMBULATORY_CARE_PROVIDER_SITE_OTHER): Payer: PPO | Admitting: Family Medicine

## 2023-04-29 ENCOUNTER — Encounter: Payer: Self-pay | Admitting: Family Medicine

## 2023-04-29 VITALS — BP 115/69 | HR 95 | Ht 71.0 in | Wt 156.0 lb

## 2023-04-29 DIAGNOSIS — N401 Enlarged prostate with lower urinary tract symptoms: Secondary | ICD-10-CM

## 2023-04-29 DIAGNOSIS — N3943 Post-void dribbling: Secondary | ICD-10-CM

## 2023-04-29 DIAGNOSIS — Z1283 Encounter for screening for malignant neoplasm of skin: Secondary | ICD-10-CM

## 2023-04-29 DIAGNOSIS — Z122 Encounter for screening for malignant neoplasm of respiratory organs: Secondary | ICD-10-CM

## 2023-04-29 DIAGNOSIS — R600 Localized edema: Secondary | ICD-10-CM

## 2023-04-29 DIAGNOSIS — F17200 Nicotine dependence, unspecified, uncomplicated: Secondary | ICD-10-CM | POA: Diagnosis not present

## 2023-04-29 DIAGNOSIS — J432 Centrilobular emphysema: Secondary | ICD-10-CM | POA: Diagnosis not present

## 2023-04-29 NOTE — Progress Notes (Signed)
BP 115/69   Pulse 95   Ht 5\' 11"  (1.803 m)   Wt 156 lb (70.8 kg)   SpO2 93%   BMI 21.76 kg/m    Subjective:   Patient ID: Spencer Miller., male    DOB: 1946/04/13, 77 y.o.   MRN: [id number]  HPI: Shahin Polczynski. is a 77 y.o. male presenting on 04/29/2023 for Medical Management of Chronic Issues   HPI COPD Patient is coming in for COPD recheck today.  He is currently on no medication.  He has a mild chronic cough but denies any major coughing spells or wheezing spells.  He has 2 nighttime symptoms per week and 2 daytime symptoms per week currently.  Patient is still smoking about a pack per day.  He says he is fine.  Denies any major issues.  BPH Patient has urinary frequency and hesitancy.  He does not want to try any of the medications for BPH and wants to go talk to urology.  We have prescribed Flomax twice in the past but he never tried  Patient wants to see dermatologist for skin check.  Patient is still smoking we will do a CT lung cancer screening.    Relevant past medical, surgical, family and social history reviewed and updated as indicated. Interim medical history since our last visit reviewed. Allergies and medications reviewed and updated.  Review of Systems  Constitutional:  Negative for chills and fever.  HENT:  Positive for congestion. Negative for ear pain and tinnitus.   Eyes:  Negative for pain and discharge.  Respiratory:  Positive for cough and wheezing. Negative for shortness of breath.   Cardiovascular:  Negative for palpitations and leg swelling.  Gastrointestinal:  Negative for abdominal pain, blood in stool, constipation and diarrhea.  Genitourinary:  Positive for difficulty urinating and frequency. Negative for dysuria and hematuria.  Musculoskeletal:  Positive for arthralgias and joint swelling. Negative for back pain, gait problem and myalgias.  Skin:  Negative for rash.  Neurological:  Negative for dizziness, weakness and headaches.   Psychiatric/Behavioral:  Negative for suicidal ideas.   All other systems reviewed and are negative.   Per HPI unless specifically indicated above   Allergies as of 04/29/2023   No Known Allergies      Medication List        Accurate as of April 29, 2023  2:57 PM. If you have any questions, ask your nurse or doctor.          STOP taking these medications    amoxicillin 500 MG capsule Commonly known as: AMOXIL Stopped by: Elige Radon Ladarrious Kirksey         Objective:   BP 115/69   Pulse 95   Ht 5\' 11"  (1.803 m)   Wt 156 lb (70.8 kg)   SpO2 93%   BMI 21.76 kg/m   Wt Readings from Last 3 Encounters:  04/29/23 156 lb (70.8 kg)  10/29/22 166 lb (75.3 kg)  01/21/22 156 lb (70.8 kg)    Physical Exam Vitals and nursing note reviewed.  Constitutional:      General: He is not in acute distress.    Appearance: He is well-developed. He is not diaphoretic.  Eyes:     General: No scleral icterus.    Conjunctiva/sclera: Conjunctivae normal.  Neck:     Thyroid: No thyromegaly.  Cardiovascular:     Rate and Rhythm: Normal rate and regular rhythm.     Heart sounds: Normal heart sounds.  No murmur heard. Pulmonary:     Effort: Pulmonary effort is normal. No respiratory distress.     Breath sounds: Normal breath sounds. No wheezing, rhonchi or rales.  Musculoskeletal:        General: No swelling. Normal range of motion.     Cervical back: Neck supple.  Lymphadenopathy:     Cervical: No cervical adenopathy.  Skin:    General: Skin is warm and dry.     Findings: No rash.  Neurological:     Mental Status: He is alert and oriented to person, place, and time.     Coordination: Coordination normal.  Psychiatric:        Behavior: Behavior normal.       Assessment & Plan:   Problem List Items Addressed This Visit       Respiratory   Emphysema of lung (HCC)   Relevant Orders   CBC with Differential/Platelet   CMP14+EGFR   Lipid panel     Genitourinary   BPH  (benign prostatic hyperplasia)   Relevant Orders   Ambulatory referral to Urology   PSA, total and free     Other   Peripheral edema   Smoking addiction   Other Visit Diagnoses     Screening for lung cancer    -  Primary   Relevant Orders   Ambulatory Referral for Lung Cancer Scre   Skin cancer screening       Relevant Orders   Ambulatory referral to Dermatology       Will do blood work today, placed referral for CT lung cancer screening and urology and dermatology. Follow up plan: Return in about 6 months (around 10/30/2023), or if symptoms worsen or fail to improve, for COPD recheck.  Counseling provided for all of the vaccine components Orders Placed This Encounter  Procedures   CBC with Differential/Platelet   CMP14+EGFR   Lipid panel   PSA, total and free   Ambulatory referral to Dermatology   Ambulatory referral to Urology   Ambulatory Referral for Lung Cancer Scre    Arville Care, MD [facility] 04/29/2023, 2:57 PM

## 2023-04-30 LAB — LIPID PANEL
Chol/HDL Ratio: 2.8 ratio (ref 0.0–5.0)
Cholesterol, Total: 161 mg/dL (ref 100–199)
HDL: 58 mg/dL (ref >39)
LDL Chol Calc (NIH): 90 mg/dL (ref 0–99)
Triglycerides: 69 mg/dL (ref 0–149)
VLDL Cholesterol Cal: 13 mg/dL (ref 5–40)

## 2023-04-30 LAB — PSA, TOTAL AND FREE
PSA, Free Pct: 37.2 %
PSA, Free: 0.67 ng/mL
Prostate Specific Ag, Serum: 1.8 ng/mL (ref 0.0–4.0)

## 2023-04-30 LAB — CMP14+EGFR
ALT: 13 IU/L (ref 0–44)
AST: 15 IU/L (ref 0–40)
Albumin: 4.1 g/dL (ref 3.8–4.8)
Alkaline Phosphatase: 79 IU/L (ref 44–121)
BUN/Creatinine Ratio: 12 (ref 10–24)
BUN: 10 mg/dL (ref 8–27)
Bilirubin Total: 0.6 mg/dL (ref 0.0–1.2)
CO2: 23 mmol/L (ref 20–29)
Calcium: 9.6 mg/dL (ref 8.6–10.2)
Chloride: 100 mmol/L (ref 96–106)
Creatinine, Ser: 0.84 mg/dL (ref 0.76–1.27)
Globulin, Total: 2.7 g/dL (ref 1.5–4.5)
Glucose: 86 mg/dL (ref 70–99)
Potassium: 5.1 mmol/L (ref 3.5–5.2)
Sodium: 136 mmol/L (ref 134–144)
Total Protein: 6.8 g/dL (ref 6.0–8.5)
eGFR: 90 mL/min/{1.73_m2} (ref >59)

## 2023-04-30 LAB — CBC WITH DIFFERENTIAL/PLATELET
Basophils Absolute: 0.1 10*3/uL (ref 0.0–0.2)
Basos: 1 %
EOS (ABSOLUTE): 0.2 10*3/uL (ref 0.0–0.4)
Eos: 2 %
Hematocrit: 40.2 % (ref 37.5–51.0)
Hemoglobin: 14 g/dL (ref 13.0–17.7)
Immature Grans (Abs): 0 10*3/uL (ref 0.0–0.1)
Immature Granulocytes: 0 %
Lymphocytes Absolute: 2.8 10*3/uL (ref 0.7–3.1)
Lymphs: 35 %
MCH: 32.6 pg (ref 26.6–33.0)
MCHC: 34.8 g/dL (ref 31.5–35.7)
MCV: 94 fL (ref 79–97)
Monocytes Absolute: 0.5 10*3/uL (ref 0.1–0.9)
Monocytes: 6 %
Neutrophils Absolute: 4.4 10*3/uL (ref 1.4–7.0)
Neutrophils: 56 %
Platelets: 260 10*3/uL (ref 150–450)
RBC: 4.3 x10E6/uL (ref 4.14–5.80)
RDW: 12 % (ref 11.6–15.4)
WBC: 8 10*3/uL (ref 3.4–10.8)

## 2023-05-02 ENCOUNTER — Encounter: Payer: Self-pay | Admitting: Family Medicine

## 2023-05-06 ENCOUNTER — Encounter: Payer: Self-pay | Admitting: Family Medicine

## 2023-05-12 ENCOUNTER — Other Ambulatory Visit: Payer: Self-pay

## 2023-05-12 DIAGNOSIS — R109 Unspecified abdominal pain: Secondary | ICD-10-CM

## 2023-05-15 ENCOUNTER — Encounter: Payer: Self-pay | Admitting: Gastroenterology

## 2023-05-15 NOTE — Progress Notes (Unsigned)
Referring Provider:Dettinger, Elige Radon, MD Primary Care Physician:  Dettinger, Elige Radon, MD Primary Gastroenterologist:  Dr. Jena Gauss  Chief Complaint  Patient presents with   Abdominal Pain    Upper right side abdominal pain, going on for about a month.    HPI:   Spencer Miller. is a 77 y.o. male presenting today at the request of Dettinger, Elige Radon, MD for recurrent abdominal pain.  Previously seen by digestive health specialist in 2022 for right upper quadrant abdominal pain.  It was noted that time, his symptoms improved after implementing antireflux measures.  Today:  1 month of RUQ abdominal pain. Initially thought he strained himself. Constant. Dull aching. Affecting his appetite. Can't eat as much. Only able to eat 3/4 of what he usually eats at one time. No nausea or vomiting. No heartburn.  Used to always have a BM every 2 days. In the last week, if he gets to the 3rd day, he will take a laxative. 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 Doesn't affect the abdominal pain.   No brbpr or melena.   Weight loss:   Down about 13 lbs since February onset.   NSAIDs: None.    Prior EGD/Colonoscopy:  Never Reports he has resisted this.  Has been doing cologuard every 3 years. Last in August 2023 and negative.   Fhx colon cancer:  Sister had colon cancer in her early 4s.  Brother with stomach cancer.   Past Medical History:  Diagnosis Date   Cataract    Bilateral    COPD (chronic obstructive pulmonary disease) (HCC)    COPD (chronic obstructive pulmonary disease) (HCC)    SCCA (squamous cell carcinoma) of skin 05/23/2013   Right Cheek(well diff) (curet and 5FU)   SCCA (squamous cell carcinoma) of skin 11/26/2015   Left Cheek(well diff) (curet and 5FU)    Past Surgical History:  Procedure Laterality Date   CATARACT EXTRACTION, BILATERAL     FRACTURE SURGERY     HERNIA REPAIR      No current outpatient medications on file.   No current facility-administered medications  for this visit.    Allergies as of 05/17/2023   (No Known Allergies)    Family History  Problem Relation Age of Onset   Obesity Mother    Melanoma Father    Colon cancer Sister        early 35s   Obesity Sister    Obesity Sister    Obesity Sister    Cancer Brother        stomach   Stomach cancer Brother    Healthy Son    Healthy Son    Healthy Son    Healthy Son     Social History   Socioeconomic History   Marital status: Married    Spouse name: Rinaldo Cloud   Number of children: 4   Years of education: Not on file   Highest education level: Not on file  Occupational History    Comment: Retired [employer] 25 years    Comment: Retired [employer] 15 years  Tobacco Use   Smoking status: Every Day    Current packs/day: 1.00    Average packs/day: 1 pack/day for 40.0 years (40.0 ttl pk-yrs)    Types: Cigarettes   Smokeless tobacco: Never  Vaping Use   Vaping status: Never Used  Substance and Sexual Activity   Alcohol use: Yes    Alcohol/week: 14.0 standard drinks of alcohol    Types: 14 Cans  of beer per week   Drug use: No   Sexual activity: Not Currently  Other Topics Concern   Not on file  Social History Narrative   Married x 43 in 2022.   1 grandchild   Social Determinants of Health   Financial Resource Strain: Low Risk  (09/24/2021)   Overall Financial Resource Strain (CARDIA)    Difficulty of Paying Living Expenses: Not hard at all  Food Insecurity: No Food Insecurity (09/24/2021)   Hunger Vital Sign    Worried About Running Out of Food in the Last Year: Never true    Ran Out of Food in the Last Year: Never true  Transportation Needs: No Transportation Needs (09/24/2021)   PRAPARE - Administrator, Civil Service (Medical): No    Lack of Transportation (Non-Medical): No  Physical Activity: Sufficiently Active (09/24/2021)   Exercise Vital Sign    Days of Exercise per Week: 5 days    Minutes of Exercise per Session: 30 min  Stress: No  Stress Concern Present (09/24/2021)   [employer] of Occupational Health - Occupational Stress Questionnaire    Feeling of Stress : Only a little  Social Connections: Moderately Integrated (09/24/2021)   Social Connection and Isolation Panel [NHANES]    Frequency of Communication with Friends and Family: More than three times a week    Frequency of Social Gatherings with Friends and Family: Once a week    Attends Religious Services: 1 to 4 times per year    Active Member of [employer] or Organizations: No    Attends Banker Meetings: Never    Marital Status: Married  Catering manager Violence: Not At Risk (09/24/2021)   Humiliation, Afraid, Rape, and Kick questionnaire    Fear of Current or Ex-Partner: No    Emotionally Abused: No    Physically Abused: No    Sexually Abused: No    Review of Systems: Gen: Denies any fever, chills, cold or flulike symptoms, presyncope, syncope. CV: Denies chest pain, heart palpitations. Resp: Denies shortness of breath, cough. GI:  see HPI  GU : Denies urinary burning, urinary frequency, urinary hesitancy MS: Denies joint pain. Derm: Denies rash. Psych: Denies depression, anxiety. Heme: See HPI  Physical Exam: BP 115/67 (BP Location: Right Arm, Patient Position: Sitting, Cuff Size: Normal)   Pulse (!) 101   Temp (!) 97.3 F (36.3 C) (Oral)   Ht 5\' 11"  (1.803 m)   Wt 153 lb 3.2 oz (69.5 kg)   SpO2 92%   BMI 21.37 kg/m  General:   Alert and oriented. Pleasant and cooperative. Well-nourished and well-developed.  Head:  Normocephalic and atraumatic. Eyes:  Without icterus, sclera clear and conjunctiva pink.  Ears:  Normal auditory acuity. Lungs:  Clear to auscultation bilaterally. No wheezes, rales, or rhonchi. No distress.  Heart:  S1, S2 present without murmurs appreciated.  Abdomen:  +BS, soft, non-tender and non-distended. No HSM noted. No guarding or rebound. No masses appreciated.  Rectal:  Deferred  Msk:  Symmetrical  without gross deformities. Normal posture. Extremities:  Without edema. Neurologic:  Alert and  oriented x4;  grossly normal neurologically. Skin:  Intact without significant lesions or rashes. Psych:  Normal mood and affect.    Assessment:  77 year old male with history of COPD, presenting today for further evaluation of right upper quadrant/right rib pain.  Symptoms have been present for at least 1 month and described as constant, dull, aching.  While rib pain may be MSK in etiology,  he also notes associated early satiety and weight loss of about 13 pounds since February.  Denies nausea, vomiting, reflux symptoms.  Denies NSAIDs.  Recent labs in August with CBC and CMP within normal limits.  Ultrasound in 2022 when he was having some similar symptoms with no evidence of gallstones.  He is never had an EGD.  We discussed possibility of EGD, but he prefers least invasive measures.  We will arrange an H. pylori breath test along with RUQ ultrasound.  If unrevealing, we will proceed with an EGD with differentials including gastritis, duodenitis, PUD, malignancy.  Notably, he does report his brother had stomach cancer.   He has also been having some mild constipation recently.  Recommended adding Benefiber daily and using MiraLAX daily or as needed.  Additionally, he has a sister with history of colon cancer in her early 50s.  He has never had a colonoscopy.  He has had Cologuard every 3 years with last in August 2023 that was negative.  He just recently mailed in FOBT and is waiting on results.  He does not desire a colonoscopy.   Plan:  H. pylori breath test RUQ ultrasound Start Benefiber 2 teaspoons daily x 2 weeks, then increase to twice daily. MiraLAX 17 g daily or as needed. Recommend EGD if RUQ ultrasound/H. pylori breath test unrevealing.   Ermalinda Memos, PA-C [facility] 05/17/2023

## 2023-05-17 ENCOUNTER — Encounter: Payer: Self-pay | Admitting: Gastroenterology

## 2023-05-17 ENCOUNTER — Ambulatory Visit: Payer: PPO | Admitting: Gastroenterology

## 2023-05-17 VITALS — BP 115/67 | HR 101 | Temp 97.3°F | Ht 71.0 in | Wt 153.2 lb

## 2023-05-17 DIAGNOSIS — K5900 Constipation, unspecified: Secondary | ICD-10-CM

## 2023-05-17 DIAGNOSIS — R6881 Early satiety: Secondary | ICD-10-CM

## 2023-05-17 DIAGNOSIS — R109 Unspecified abdominal pain: Secondary | ICD-10-CM | POA: Diagnosis not present

## 2023-05-17 DIAGNOSIS — R634 Abnormal weight loss: Secondary | ICD-10-CM | POA: Diagnosis not present

## 2023-05-17 NOTE — Patient Instructions (Addendum)
Please have H pylori breath test completed at [facility].   We will arrange for you to have an ultra sound at [facility].   Start Benefiber 2 teaspoons daily x 2 weeks then increase to twice daily.   You can use Miralax 17 g in 8 oz of water or other non-carbonated beverage of your choice. You can take this daily or as needed for constipation.   We will call you with results and further recommendations.   It was nice to meet you today!  Ermalinda Memos, PA-C [facility]

## 2023-05-18 ENCOUNTER — Encounter: Payer: Self-pay | Admitting: Family Medicine

## 2023-05-19 LAB — H. PYLORI BREATH TEST

## 2023-05-20 ENCOUNTER — Other Ambulatory Visit: Payer: Self-pay | Admitting: *Deleted

## 2023-05-20 DIAGNOSIS — R109 Unspecified abdominal pain: Secondary | ICD-10-CM

## 2023-05-26 ENCOUNTER — Ambulatory Visit (HOSPITAL_COMMUNITY)
Admission: RE | Admit: 2023-05-26 | Discharge: 2023-05-26 | Disposition: A | Discharge status: Home / Self Care | Payer: PPO | Source: Ambulatory Visit | Attending: Gastroenterology | Admitting: Gastroenterology

## 2023-05-26 DIAGNOSIS — R109 Unspecified abdominal pain: Secondary | ICD-10-CM | POA: Insufficient documentation

## 2023-05-26 DIAGNOSIS — R6881 Early satiety: Secondary | ICD-10-CM | POA: Diagnosis not present

## 2023-05-26 DIAGNOSIS — R1011 Right upper quadrant pain: Secondary | ICD-10-CM | POA: Diagnosis not present

## 2023-05-26 DIAGNOSIS — K7689 Other specified diseases of liver: Secondary | ICD-10-CM | POA: Diagnosis not present

## 2023-05-27 ENCOUNTER — Telehealth: Payer: Self-pay | Admitting: Family Medicine

## 2023-05-28 ENCOUNTER — Other Ambulatory Visit: Payer: Self-pay | Admitting: Gastroenterology

## 2023-05-28 DIAGNOSIS — A048 Other specified bacterial intestinal infections: Secondary | ICD-10-CM

## 2023-05-28 LAB — H. PYLORI ANTIGEN, STOOL: H pylori Ag, Stl: POSITIVE — AB

## 2023-05-28 MED ORDER — BISMUTH SUBSALICYLATE 262 MG PO TABS
524.0000 mg | ORAL_TABLET | Freq: Four times a day (QID) | ORAL | 0 refills | Status: AC
Start: 2023-05-28 — End: 2023-06-11

## 2023-05-28 MED ORDER — TETRACYCLINE HCL 500 MG PO CAPS
500.0000 mg | ORAL_CAPSULE | Freq: Four times a day (QID) | ORAL | 0 refills | Status: AC
Start: 2023-05-28 — End: 2023-06-11

## 2023-05-28 MED ORDER — METRONIDAZOLE 500 MG PO TABS
500.0000 mg | ORAL_TABLET | Freq: Three times a day (TID) | ORAL | 0 refills | Status: AC
Start: 2023-05-28 — End: 2023-06-11

## 2023-05-28 MED ORDER — PANTOPRAZOLE SODIUM 40 MG PO TBEC
40.0000 mg | DELAYED_RELEASE_TABLET | Freq: Two times a day (BID) | ORAL | 0 refills | Status: DC
Start: 2023-05-28 — End: 2023-07-07

## 2023-05-28 NOTE — Telephone Encounter (Signed)
Patient is checking on Scheduling Status.  Please Advise.

## 2023-06-07 ENCOUNTER — Encounter: Payer: Self-pay | Admitting: Family Medicine

## 2023-06-15 ENCOUNTER — Telehealth: Payer: Self-pay | Admitting: *Deleted

## 2023-06-15 ENCOUNTER — Encounter: Payer: Self-pay | Admitting: *Deleted

## 2023-06-15 NOTE — Telephone Encounter (Signed)
We will have to check for H pylori eradication in a few weeks before we know if he needs additional antibiotics.   When did the dark tarry stools start? Was it while he was taking the medications for H pylori? If so, it is likely secondary to bismuth subsalicylate. When was his last day of treatment? When was the last black stool?   Regardless, I would recommend updating CBC ASAP and arranging OV for follow-up to discuss further evaluation of his symptoms.

## 2023-06-15 NOTE — Telephone Encounter (Signed)
Pt called and states he has finished medications for H. Pylori, but he is still not feeling well. He wants to know if he needs to be on another round of antibiotics. Also his stools are dark and tarry. Please advise.

## 2023-06-16 ENCOUNTER — Encounter: Payer: Self-pay | Admitting: *Deleted

## 2023-06-16 NOTE — Telephone Encounter (Signed)
We can leave the appointment scheduled for 10/30 until I find out the answers to the questions mentioned in prior message and have an updated CBC.   I tried calling patient today, but left message requesting return call. Toni Amend, if you can try reaching out to him later today as well, that would be great.

## 2023-06-17 NOTE — Telephone Encounter (Signed)
Great!

## 2023-06-17 NOTE — Telephone Encounter (Signed)
Spoke to pt's wife she informed me that the dark stools started when he began the medications. She states he stopped the meds on Monday and had a bowel movement on Tuesday and it still a little dark, but she thinks it was due to the medication also. She states they will keep the OV at the end of the month. She said he is feeling much better.

## 2023-06-30 ENCOUNTER — Other Ambulatory Visit: Payer: Self-pay | Admitting: Emergency Medicine

## 2023-06-30 DIAGNOSIS — Z87891 Personal history of nicotine dependence: Secondary | ICD-10-CM

## 2023-06-30 DIAGNOSIS — F17210 Nicotine dependence, cigarettes, uncomplicated: Secondary | ICD-10-CM

## 2023-06-30 DIAGNOSIS — Z122 Encounter for screening for malignant neoplasm of respiratory organs: Secondary | ICD-10-CM

## 2023-07-02 ENCOUNTER — Ambulatory Visit (INDEPENDENT_AMBULATORY_CARE_PROVIDER_SITE_OTHER): Payer: PPO | Admitting: Acute Care

## 2023-07-02 DIAGNOSIS — F17210 Nicotine dependence, cigarettes, uncomplicated: Secondary | ICD-10-CM

## 2023-07-02 NOTE — Progress Notes (Addendum)
 Provider Attestation I agree with the documentation of the Shared Decision Making visit,  smoking cessation counseling if appropriate, and verification or eligibility for lung cancer screening as documented by the RN Nurse Navigator.   Lauraine PHEBE Lites, MSN, AGACNP-BC [facility] See Amion for personal pager PCCM on call pager [phone]     Virtual Visit via Telephone Note  I connected with Zachary DELENA Clide Mickey. on 07/02/23 at 10:30 AM EDT by telephone and verified that I am speaking with the correct person using two identifiers.  Location: Patient: Spencer Miller Provider: Laneta Speaks   I discussed the limitations, risks, security and privacy concerns of performing an evaluation and management service by telephone and the availability of in person appointments. I also discussed with the patient that there may be a patient responsible charge related to this service. The patient expressed understanding and agreed to proceed.    Shared Decision Making Visit Lung Cancer Screening Program [phone])   Eligibility: Age 60 y.o. Pack Years Smoking History Calculation 56 (# packs/per year x # years smoked) Recent History of coughing up blood  no Unexplained weight loss? no ( >Than 15 pounds within the last 6 months ) Prior History Lung / other cancer no (Diagnosis within the last 5 years already requiring surveillance chest CT Scans). Smoking Status Current Smoker   Visit Components: Discussion included one or more decision making aids. yes Discussion included risk/benefits of screening. yes Discussion included potential follow up diagnostic testing for abnormal scans. yes Discussion included meaning and risk of over diagnosis. yes Discussion included meaning and risk of False Positives. yes Discussion included meaning of total radiation exposure. yes  Counseling Included: Importance of adherence to annual lung cancer LDCT screening. yes Impact of  comorbidities on ability to participate in the program. yes Ability and willingness to under diagnostic treatment. yes  Smoking Cessation Counseling: Current Smokers:  Discussed importance of smoking cessation. yes Information about tobacco cessation classes and interventions provided to patient. yes Patient provided with ticket for LDCT Scan. no Symptomatic Patient. no  Counseling(Intermediate counseling: > three minutes) 99406 Diagnosis Code: Tobacco Use Z72.0 Asymptomatic Patient yes  Counseling (Intermediate counseling: > three minutes counseling) [id number] Written Order for Lung Cancer Screening with LDCT placed in Epic. Yes (CT Chest Lung Cancer Screening Low Dose W/O CM) [id number] Z12.2-Screening of respiratory organs Z87.891-Personal history of nicotine dependence   Laneta Speaks, RN

## 2023-07-02 NOTE — Patient Instructions (Signed)

## 2023-07-04 NOTE — Progress Notes (Unsigned)
Referring Provider: Dettinger, Elige Radon, MD Primary Care Physician:  Dettinger, Elige Radon, MD Primary GI Physician: Dr. Jena Gauss  No chief complaint on file.   HPI:   Spencer Miller. is a 77 y.o. male presenting today for follow-up of right-sided abdominal pain, early satiety, weight loss, constipation.  Last seen in the office 05/17/2023.  He reported 1 month of RUQ abdominal pain described as constant, dull, aching which was affecting his appetite.  Only able to eat three quarters of what he usually ate.  Denied nausea, vomiting, heartburn.  Also noted skipping 3 days between bowel movements and having to take a laxative whereas he used to have a bowel movement every 2 days.  When he did have a bowel movement, he had no improvement in his abdominal pain.  He had lost 13 pounds since February.  Denied NSAIDs or overt bleeding.  He had never had EGD or colonoscopy stating he had resisted this and was doing Cologuard every 3 years.  Last Cologuard in August 2023 was negative.  He did report sister with colon cancer in her 74s and brother with stomach cancer.  Patient was requesting least invasive measures possible.  Recommended H. pylori breath test, RUQ ultrasound, Benefiber daily, MiraLAX daily or as needed.  H.  Pylori breath test with insufficient, so stool antigen was completed and positive.  He was treated with bismuth quadruple therapy with tetracycline, metronidazole, pantoprazole twice daily (prescription sent 9/20).  Today:    Past Medical History:  Diagnosis Date   Cataract    Bilateral    COPD (chronic obstructive pulmonary disease) (HCC)    COPD (chronic obstructive pulmonary disease) (HCC)    SCCA (squamous cell carcinoma) of skin 05/23/2013   Right Cheek(well diff) (curet and 5FU)   SCCA (squamous cell carcinoma) of skin 11/26/2015   Left Cheek(well diff) (curet and 5FU)    Past Surgical History:  Procedure Laterality Date   CATARACT EXTRACTION, BILATERAL      FRACTURE SURGERY     HERNIA REPAIR      Current Outpatient Medications  Medication Sig Dispense Refill   pantoprazole (PROTONIX) 40 MG tablet Take 1 tablet (40 mg total) by mouth 2 (two) times daily for 14 days. 28 tablet 0   No current facility-administered medications for this visit.    Allergies as of 07/07/2023   (No Known Allergies)    Family History  Problem Relation Age of Onset   Obesity Mother    Melanoma Father    Colon cancer Sister        early 35s   Obesity Sister    Obesity Sister    Obesity Sister    Cancer Brother        stomach   Stomach cancer Brother    Healthy Son    Healthy Son    Healthy Son    Healthy Son     Social History   Socioeconomic History   Marital status: Married    Spouse name: Rinaldo Cloud   Number of children: 4   Years of education: Not on file   Highest education level: Not on file  Occupational History    Comment: Retired [employer] 25 years    Comment: Retired [employer] 15 years  Tobacco Use   Smoking status: Every Day    Current packs/day: 1.00    Average packs/day: 1 pack/day for 56.8 years (56.8 ttl pk-yrs)    Types: Cigarettes    Start date: 1968  Smokeless tobacco: Never  Vaping Use   Vaping status: Never Used  Substance and Sexual Activity   Alcohol use: Yes    Alcohol/week: 14.0 standard drinks of alcohol    Types: 14 Cans of beer per week   Drug use: No   Sexual activity: Not Currently  Other Topics Concern   Not on file  Social History Narrative   Married x 43 in 2022.   1 grandchild   Social Determinants of Health   Financial Resource Strain: Low Risk  (09/24/2021)   Overall Financial Resource Strain (CARDIA)    Difficulty of Paying Living Expenses: Not hard at all  Food Insecurity: No Food Insecurity (09/24/2021)   Hunger Vital Sign    Worried About Running Out of Food in the Last Year: Never true    Ran Out of Food in the Last Year: Never true  Transportation Needs: No Transportation  Needs (09/24/2021)   PRAPARE - Administrator, Civil Service (Medical): No    Lack of Transportation (Non-Medical): No  Physical Activity: Sufficiently Active (09/24/2021)   Exercise Vital Sign    Days of Exercise per Week: 5 days    Minutes of Exercise per Session: 30 min  Stress: No Stress Concern Present (09/24/2021)   [employer] of Occupational Health - Occupational Stress Questionnaire    Feeling of Stress : Only a little  Social Connections: Moderately Integrated (09/24/2021)   Social Connection and Isolation Panel [NHANES]    Frequency of Communication with Friends and Family: More than three times a week    Frequency of Social Gatherings with Friends and Family: Once a week    Attends Religious Services: 1 to 4 times per year    Active Member of [employer] or Organizations: No    Attends Banker Meetings: Never    Marital Status: Married    Review of Systems: Gen: Denies fever, chills, anorexia. Denies fatigue, weakness, weight loss.  CV: Denies chest pain, palpitations, syncope, peripheral edema, and claudication. Resp: Denies dyspnea at rest, cough, wheezing, coughing up blood, and pleurisy. GI: Denies vomiting blood, jaundice, and fecal incontinence.   Denies dysphagia or odynophagia. Derm: Denies rash, itching, dry skin Psych: Denies depression, anxiety, memory loss, confusion. No homicidal or suicidal ideation.  Heme: Denies bruising, bleeding, and enlarged lymph nodes.  Physical Exam: There were no vitals taken for this visit. General:   Alert and oriented. No distress noted. Pleasant and cooperative.  Head:  Normocephalic and atraumatic. Eyes:  Conjuctiva clear without scleral icterus. Heart:  S1, S2 present without murmurs appreciated. Lungs:  Clear to auscultation bilaterally. No wheezes, rales, or rhonchi. No distress.  Abdomen:  +BS, soft, non-tender and non-distended. No rebound or guarding. No HSM or masses noted. Msk:  Symmetrical  without gross deformities. Normal posture. Extremities:  Without edema. Neurologic:  Alert and  oriented x4 Psych:  Normal mood and affect.    Assessment:     Plan:  ***   Ermalinda Memos, PA-C [facility] 07/07/2023

## 2023-07-05 ENCOUNTER — Ambulatory Visit: Payer: PPO | Admitting: Urology

## 2023-07-06 ENCOUNTER — Ambulatory Visit (HOSPITAL_COMMUNITY)
Admission: RE | Admit: 2023-07-06 | Discharge: 2023-07-06 | Disposition: A | Discharge status: Home / Self Care | Payer: PPO | Source: Ambulatory Visit | Attending: Acute Care | Admitting: Acute Care

## 2023-07-06 DIAGNOSIS — Z122 Encounter for screening for malignant neoplasm of respiratory organs: Secondary | ICD-10-CM | POA: Insufficient documentation

## 2023-07-06 DIAGNOSIS — F17210 Nicotine dependence, cigarettes, uncomplicated: Secondary | ICD-10-CM | POA: Insufficient documentation

## 2023-07-06 DIAGNOSIS — Z87891 Personal history of nicotine dependence: Secondary | ICD-10-CM | POA: Insufficient documentation

## 2023-07-07 ENCOUNTER — Encounter: Payer: Self-pay | Admitting: Gastroenterology

## 2023-07-07 ENCOUNTER — Ambulatory Visit: Payer: PPO | Admitting: Gastroenterology

## 2023-07-07 VITALS — BP 114/74 | HR 90 | Temp 97.9°F | Ht 71.0 in | Wt 153.4 lb

## 2023-07-07 DIAGNOSIS — R6881 Early satiety: Secondary | ICD-10-CM

## 2023-07-07 DIAGNOSIS — R0781 Pleurodynia: Secondary | ICD-10-CM | POA: Diagnosis not present

## 2023-07-07 DIAGNOSIS — Z8619 Personal history of other infectious and parasitic diseases: Secondary | ICD-10-CM

## 2023-07-07 DIAGNOSIS — R634 Abnormal weight loss: Secondary | ICD-10-CM

## 2023-07-07 MED ORDER — PANTOPRAZOLE SODIUM 40 MG PO TBEC: 40.0000 mg | DELAYED_RELEASE_TABLET | Freq: Every day | ORAL | 1 refills | Status: DC

## 2023-07-07 NOTE — Patient Instructions (Addendum)
We will get you scheduled for an upper endoscopy with Dr. Jena Gauss in the near future.   Start pantoprazole 40 mg daily 30 minutes before breakfast.   As we discussed, I think that your right rib pain is secondary to musculoskeletal cause, possibly pulled muscle or inflammation within the cartilage between your ribs. You can use tylenol as needed for now and try to take it easy. I would avoid all NSAID products due to your upper GI symptoms.   We will see you back in the office after your procedure.   Ermalinda Memos, PA-C [facility]

## 2023-07-08 ENCOUNTER — Encounter: Payer: Self-pay | Admitting: *Deleted

## 2023-07-12 ENCOUNTER — Encounter: Payer: Self-pay | Admitting: Family Medicine

## 2023-07-23 ENCOUNTER — Other Ambulatory Visit: Payer: Self-pay

## 2023-07-23 ENCOUNTER — Encounter: Payer: Self-pay | Admitting: Family Medicine

## 2023-07-23 ENCOUNTER — Ambulatory Visit (INDEPENDENT_AMBULATORY_CARE_PROVIDER_SITE_OTHER): Payer: PPO | Admitting: Family Medicine

## 2023-07-23 VITALS — BP 95/50 | HR 99 | Temp 98.2°F | Ht 71.0 in | Wt 155.5 lb

## 2023-07-23 DIAGNOSIS — Z122 Encounter for screening for malignant neoplasm of respiratory organs: Secondary | ICD-10-CM

## 2023-07-23 DIAGNOSIS — M65342 Trigger finger, left ring finger: Secondary | ICD-10-CM | POA: Diagnosis not present

## 2023-07-23 NOTE — Patient Instructions (Signed)
Trigger Finger  Trigger finger, also called stenosing tenosynovitis, is a condition that causes a finger or thumb to get stuck in a bent position. Each finger has tough, cord-like tissue (tendon) that connects muscle to bone, and each tendon passes through a tunnel of tissue (tendon sheath). The tendon sheaths are held close to the bone by a pulley. There is a pulley called the A1 pulley that is involved in the triggering of a finger or thumb. To move your finger, your tendon needs to glide freely through the sheath. Trigger finger happens when the tendon or the sheath thickens, making it difficult to bend or straighten your finger as the thickened tendon gets stuck in the A1 pulley. Trigger finger can affect any of the fingers or the thumbs. Mild cases may clear up with rest and medicine. Severe cases require more treatment. What are the causes? Trigger finger or thumb is caused by a thickened finger tendon or tendon sheath. The cause of this thickening is not known. What increases the risk? The following factors may make you more likely to develop this condition: Doing the same movements many times (repetitive activity) that require a strong grip. Having certain health conditions. These include rheumatoid arthritis, gout, carpal tunnel syndrome, or diabetes. Being 40-60 years old. Being male. What are the signs or symptoms? Symptoms of this condition include: Pain when bending or straightening your finger. Tenderness, swelling, or a lump in the palm of your hand just below the finger joint. Hearing a noise like a pop or a snap when you try to straighten your finger. Feeling a catch or locked feeling when you try to straighten your finger. Being unable to straighten your finger without help from your other hand. How is this diagnosed? This condition is diagnosed based on your symptoms and a physical exam. How is this treated? This condition may be treated by: Resting your finger and  avoiding activities that make symptoms worse. Wearing a finger splint to keep your finger extended. Taking NSAIDs, such as ibuprofen, to relieve pain and swelling around the tendon. Doing gentle exercises to stretch the finger as told by your health care provider. Having medicine that reduces swelling and inflammation (steroids) injected into the tendon sheath. Injections may need to be repeated. Trigger finger release. This surgery is done to open the pulley. This may be done if other treatments do not work and you cannot straighten or bend your finger. You may need hand therapy after surgery. Follow these instructions at home: If you have a removable splint: Wear the splint as told by your provider. Remove it only as told by your provider. Check the skin around the splint every day. Tell your provider about any concerns. Loosen the splint if your fingers tingle, become numb, or turn cold and blue. Keep the splint clean and dry. If the splint is not waterproof: Do not let it get wet. Cover it with a watertight covering when you take a bath or shower. Managing pain, stiffness, and swelling     If told, put ice on the painful area. If you have a removable splint, remove it as told by your health care provider. Put ice in a plastic bag. Place a towel between your skin and the bag or between your splint and the bag. Leave the ice on for 20 minutes, 2-3 times a day. If told, apply heat to the affected area as often as told by your provider. Use the heat source that your provider recommends, such as   a moist heat pack or a heating pad. Place a towel between your skin and the heat source. Leave the heat on for 20-30 minutes. If your skin turns bright red, remove the ice or heat right away to prevent skin damage. The risk of damage is higher if you cannot feel pain, heat, or cold. Activity Rest your finger as told by your provider. Avoid activities that make the pain worse. Return to your  normal activities as told by your provider. Ask your provider what activities are safe for you. Do exercises as told by your provider. Ask your provider when it is safe to drive if you have a splint on your hand. General instructions Take over-the-counter and prescription medicines only as told by your provider. Keep all follow-up visits. These are needed to see how you are progressing. Contact a health care provider if: Your symptoms are not improving with home care. This information is not intended to replace advice given to you by your health care provider. Make sure you discuss any questions you have with your health care provider. Document Revised: 04/10/2022 Document Reviewed: 04/10/2022 Elsevier Patient Education  2024 Elsevier Inc.  

## 2023-07-23 NOTE — Progress Notes (Signed)
   Acute Office Visit  Subjective:     Patient ID: Spencer Miller., male    DOB: Aug 24, 1946, 77 y.o.   MRN: [id number]  Chief Complaint  Patient presents with   trigger finger    HPI Patient is in today for pain in his left ring finger for 2 onths. Pain is sharp with certain movements in the middle joint. He had had trouble opening this finger. No injury, erythema, swelling, exudate. He has not tried any remedies. Symptoms have gotten a little better other this week.   ROS As per HPI.      Objective:    BP (!) 95/50   Pulse 99   Temp 98.2 F (36.8 C) (Temporal)   Ht 5\' 11"  (1.803 m)   Wt 155 lb 8 oz (70.5 kg)   SpO2 96%   BMI 21.69 kg/m    Physical Exam Vitals and nursing note reviewed.  Constitutional:      General: He is not in acute distress.    Appearance: He is not ill-appearing, toxic-appearing or diaphoretic.  Pulmonary:     Effort: Pulmonary effort is normal. No respiratory distress.  Musculoskeletal:     Left hand: No swelling or bony tenderness. Normal sensation. Normal capillary refill. Normal pulse.     Comments: Mild tenderness along left ring finger tendon sheath with slight decreased range of motion.   Skin:    General: Skin is warm and dry.  Neurological:     General: No focal deficit present.     Mental Status: He is alert and oriented to person, place, and time.  Psychiatric:        Mood and Affect: Mood normal.        Behavior: Behavior normal.     No results found for any visits on 07/23/23.      Assessment & Plan:   Jyden was seen today for trigger finger.  Diagnoses and all orders for this visit:  Trigger ring finger of left hand Discussed injection, NSAIDs, rest, stretching as treatment option. He will try conservative treatment and will call back to schedule an injection with a trained provider if he wishes. He would like to think about this first.   Return to office for new or worsening symptoms, or if symptoms persist.    The patient indicates understanding of these issues and agrees with the plan.  Gabriel Earing, FNP

## 2023-07-30 ENCOUNTER — Ambulatory Visit: Payer: PPO | Admitting: Urology

## 2023-07-30 VITALS — BP 134/78 | HR 93

## 2023-07-30 DIAGNOSIS — N401 Enlarged prostate with lower urinary tract symptoms: Secondary | ICD-10-CM | POA: Diagnosis not present

## 2023-07-30 DIAGNOSIS — N3943 Post-void dribbling: Secondary | ICD-10-CM

## 2023-07-30 DIAGNOSIS — R351 Nocturia: Secondary | ICD-10-CM | POA: Diagnosis not present

## 2023-07-30 LAB — URINALYSIS, ROUTINE W REFLEX MICROSCOPIC
Bilirubin, UA: NEGATIVE
Glucose, UA: NEGATIVE
Ketones, UA: NEGATIVE
Leukocytes,UA: NEGATIVE
Nitrite, UA: NEGATIVE
Protein,UA: NEGATIVE
RBC, UA: NEGATIVE
Specific Gravity, UA: 1.025 (ref 1.005–1.030)
Urobilinogen, Ur: 2 mg/dL — ABNORMAL HIGH (ref 0.2–1.0)
pH, UA: 6.5 (ref 5.0–7.5)

## 2023-07-30 MED ORDER — ALFUZOSIN HCL ER 10 MG PO TB24: 10.0000 mg | ORAL_TABLET | Freq: Every day | ORAL | 11 refills | Status: DC

## 2023-07-30 NOTE — Progress Notes (Unsigned)
07/30/2023 11:49 AM   Ronney Lion [birth year] [id number]  Referring provider: Dettinger, Elige Radon, MD [address] [place],  [place] [address]  Nocturia   HPI: Mr Spencer Miller is a 77yo here for evaluation of nocturia. For the past several years he has noted nocturia 1-2x and he cannot fall asleep after the first time he wakes up. Urinary stream is intermittently strong.  His stream is stronger when he drinks alcohol. No dysuria or hematuria. IPSS 5 QOL 4 on no BPH therapy. PSA 1.8.     PMH: Past Medical History:  Diagnosis Date   Cataract    Bilateral    COPD (chronic obstructive pulmonary disease) (HCC)    COPD (chronic obstructive pulmonary disease) (HCC)    H. pylori infection    bismuth quadruple therapy with tetracycline, metronidazole, pantoprazole twice daily   SCCA (squamous cell carcinoma) of skin 05/23/2013   Right Cheek(well diff) (curet and 5FU)   SCCA (squamous cell carcinoma) of skin 11/26/2015   Left Cheek(well diff) (curet and 5FU)    Surgical History: Past Surgical History:  Procedure Laterality Date   CATARACT EXTRACTION, BILATERAL     FRACTURE SURGERY     HERNIA REPAIR      Home Medications:  Allergies as of 07/30/2023   No Known Allergies      Medication List        Accurate as of July 30, 2023 11:49 AM. If you have any questions, ask your nurse or doctor.          pantoprazole 40 MG tablet Commonly known as: PROTONIX Take 1 tablet (40 mg total) by mouth daily.        Allergies: No Known Allergies  Family History: Family History  Problem Relation Age of Onset   Obesity Mother    Melanoma Father    Colon cancer Sister        early 61s   Obesity Sister    Obesity Sister    Obesity Sister    Cancer Brother        stomach   Stomach cancer Brother    Healthy Son    Healthy Son    Healthy Son    Healthy Son     Social History:  reports that he has been smoking cigarettes. He started smoking about 56 years ago. He  has a 56.9 pack-year smoking history. He has never used smokeless tobacco. He reports current alcohol use of about 14.0 standard drinks of alcohol per week. He reports that he does not use drugs.  ROS: All other review of systems were reviewed and are negative except what is noted above in HPI  Physical Exam: BP 134/78   Pulse 93   Constitutional:  Alert and oriented, No acute distress. HEENT: [place] AT, moist mucus membranes.  Trachea midline, no masses. Cardiovascular: No clubbing, cyanosis, or edema. Respiratory: Normal respiratory effort, no increased work of breathing. GI: Abdomen is soft, nontender, nondistended, no abdominal masses GU: No CVA tenderness. Circumcised phallus. No masses/lesions on penis, testis, scrotum. Prostate 40g smooth no nodules no induration.  Lymph: No cervical or inguinal lymphadenopathy. Skin: No rashes, bruises or suspicious lesions. Neurologic: Grossly intact, no focal deficits, moving all 4 extremities. Psychiatric: Normal mood and affect.  Laboratory Data: Lab Results  Component Value Date   WBC 8.0 04/29/2023   HGB 14.0 04/29/2023   HCT 40.2 04/29/2023   MCV 94 04/29/2023   PLT 260 04/29/2023    Lab Results  Component Value Date   CREATININE 0.84 04/29/2023    No results found for: "PSA"  No results found for: "TESTOSTERONE"  No results found for: "HGBA1C"  Urinalysis No results found for: "COLORURINE", "APPEARANCEUR", "LABSPEC", "PHURINE", "GLUCOSEU", "HGBUR", "BILIRUBINUR", "KETONESUR", "PROTEINUR", "UROBILINOGEN", "NITRITE", "LEUKOCYTESUR"  No results found for: "LABMICR", "WBCUA", "RBCUA", "LABEPIT", "MUCUS", "BACTERIA"  Pertinent Imaging: *** No results found for this or any previous visit.  No results found for this or any previous visit.  No results found for this or any previous visit.  No results found for this or any previous visit.  No results found for this or any previous visit.  No valid procedures specified. No  results found for this or any previous visit.  No results found for this or any previous visit.   Assessment & Plan:    1. Benign prostatic hyperplasia with post-void dribbling We discussed the management of his BPH including continued medical therapy, Rezum, Urolift, TURP and simple prostatectomy. After discussing the options the patient has elected to proceed with Medical therapy. We will start uroxatral 10mg . Risks/benefits/alternatives discussed.  - Urinalysis, Routine w reflex microscopic - BLADDER SCAN AMB NON-IMAGING  2. Nocturia -start uroxatral 10mg  at bedtime    No follow-ups on file.  Wilkie Aye, MD  [facility] [place]

## 2023-07-30 NOTE — Progress Notes (Unsigned)
post void residual=0 ?

## 2023-08-02 ENCOUNTER — Encounter: Payer: Self-pay | Admitting: Family Medicine

## 2023-08-03 ENCOUNTER — Encounter: Payer: Self-pay | Admitting: Urology

## 2023-08-03 ENCOUNTER — Other Ambulatory Visit: Payer: Self-pay

## 2023-08-03 DIAGNOSIS — Z87891 Personal history of nicotine dependence: Secondary | ICD-10-CM

## 2023-08-03 DIAGNOSIS — F17210 Nicotine dependence, cigarettes, uncomplicated: Secondary | ICD-10-CM

## 2023-08-03 DIAGNOSIS — Z122 Encounter for screening for malignant neoplasm of respiratory organs: Secondary | ICD-10-CM

## 2023-08-03 NOTE — Patient Instructions (Signed)

## 2023-08-09 ENCOUNTER — Other Ambulatory Visit: Payer: Self-pay | Admitting: Family Medicine

## 2023-08-09 ENCOUNTER — Telehealth: Payer: Self-pay | Admitting: Family Medicine

## 2023-08-09 DIAGNOSIS — I719 Aortic aneurysm of unspecified site, without rupture: Secondary | ICD-10-CM

## 2023-08-09 NOTE — Telephone Encounter (Signed)
-----   Message from Elige Radon Dettinger sent at 08/04/2023  7:45 AM EST ----- Patient's lungs show no signs of cancer.  Patient does have a slightly dilated aorta at 4.2 cm, please place referral to vein and vascular for this.  Diagnosis aortic aneurysm.  Lungs show emphysema changes which is to be expected. ----- Message ----- From: Karlton Lemon, RN Sent: 08/03/2023   8:14 AM EST To: Elige Radon Dettinger, MD

## 2023-08-09 NOTE — Telephone Encounter (Signed)
Patients wife aware referral placed

## 2023-08-10 ENCOUNTER — Other Ambulatory Visit: Payer: Self-pay

## 2023-08-10 DIAGNOSIS — I719 Aortic aneurysm of unspecified site, without rupture: Secondary | ICD-10-CM

## 2023-08-13 NOTE — Patient Instructions (Signed)
Spencer Miller.  08/13/2023     @PREFPERIOPPHARMACY @   Your procedure is scheduled on 08/18/2023.   Report to [facility] at 7:15 A.M.   Call this number if you have problems the morning of surgery:  [phone]  If you experience any cold or flu symptoms such as cough, fever, chills, shortness of breath, etc. between now and your scheduled surgery, please notify us at the above number.   Remember:  Please Follow the Diet Instructions given to you by Dr Luvenia Starch office.   You may have clear liquids until 5:15am the morning of the procedure.    Take these medicines the morning of surgery with A SIP OF WATER : Pantoprazole    Do not wear jewelry, make-up or nail polish, including gel polish,  artificial nails, or any other type of covering on natural nails (fingers and  toes).  Do not wear lotions, powders, or perfumes, or deodorant.  Do not shave 48 hours prior to surgery.  Men may shave face and neck.  Do not bring valuables to the hospital.  [facility] is not responsible for any belongings or valuables.  Contacts, dentures or bridgework may not be worn into surgery.  Leave your suitcase in the car.  After surgery it may be brought to your room.  For patients admitted to the hospital, discharge time will be determined by your treatment team.  Patients discharged the day of surgery will not be allowed to drive home.   Name and phone number of your driver:   Family Special instructions:  N/a  Please read over the following fact sheets that you were given. Care and Recovery After Surgery  Upper Endoscopy, Adult Upper endoscopy is a procedure to look inside the upper GI (gastrointestinal) tract. The upper GI tract is made up of: The esophagus. This is the part of the body that moves food from your mouth to your stomach. The stomach. The duodenum. This is the first part of your small intestine. This procedure is also called esophagogastroduodenoscopy (EGD) or  gastroscopy. In this procedure, your health care provider passes a thin, flexible tube (endoscope) through your mouth and down your esophagus into your stomach and into your duodenum. A small camera is attached to the end of the tube. Images from the camera appear on a monitor in the exam room. During this procedure, your health care provider may also remove a small piece of tissue to be sent to a lab and examined under a microscope (biopsy). Your health care provider may do an upper endoscopy to diagnose cancers of the upper GI tract. You may also have this procedure to find the cause of other conditions, such as: Stomach pain. Heartburn. Pain or problems when swallowing. Nausea and vomiting. Stomach bleeding. Stomach ulcers. Tell a health care provider about: Any allergies you have. All medicines you are taking, including vitamins, herbs, eye drops, creams, and over-the-counter medicines. Any problems you or family members have had with anesthetic medicines. Any bleeding problems you have. Any surgeries you have had. Any medical conditions you have. Whether you are pregnant or may be pregnant. What are the risks? Your healthcare provider will talk with you about risks. These may include: Infection. Bleeding. Allergic reactions to medicines. A tear or hole (perforation) in the esophagus, stomach, or duodenum. What happens before the procedure? When to stop eating and drinking Follow instructions from your health care provider about what you may eat and drink. These may include: 8 hours  before your procedure Stop eating most foods. Do not eat meat, fried foods, or fatty foods. Eat only light foods, such as toast or crackers. All liquids are okay except energy drinks and alcohol. 6 hours before your procedure Stop eating. Drink only clear liquids, such as water, clear fruit juice, black coffee, plain tea, and sports drinks. Do not drink energy drinks or alcohol. 2 hours before your  procedure Stop drinking all liquids. You may be allowed to take medicines with small sips of water. If you do not follow your health care provider's instructions, your procedure may be delayed or canceled. Medicines Ask your health care provider about: Changing or stopping your regular medicines. This is especially important if you are taking diabetes medicines or blood thinners. Taking medicines such as aspirin and ibuprofen. These medicines can thin your blood. Do not take these medicines unless your health care provider tells you to take them. Taking over-the-counter medicines, vitamins, herbs, and supplements. General instructions If you will be going home right after the procedure, plan to have a responsible adult: Take you home from the hospital or clinic. You will not be allowed to drive. Care for you for the time you are told. What happens during the procedure?  An IV will be inserted into one of your veins. You may be given one or more of the following: A medicine to help you relax (sedative). A medicine to numb the throat (local anesthetic). You will lie on your left side on an exam table. Your health care provider will pass the endoscope through your mouth and down your esophagus. Your health care provider will use the scope to check the inside of your esophagus, stomach, and duodenum. Biopsies may be taken. The endoscope will be removed. The procedure may vary among health care providers and hospitals. What happens after the procedure? Your blood pressure, heart rate, breathing rate, and blood oxygen level will be monitored until you leave the hospital or clinic. When your throat is no longer numb, you may be given some fluids to drink. If you were given a sedative during the procedure, it can affect you for several hours. Do not drive or operate machinery until your health care provider says that it is safe. It is up to you to get the results of your procedure. Ask your  health care provider, or the department that is doing the procedure, when your results will be ready. Contact a health care provider if you: Have a sore throat that lasts longer than 1 day. Have a fever. Get help right away if you: Vomit blood or your vomit looks like coffee grounds. Have bloody, black, or tarry stools. Have a very bad sore throat or you cannot swallow. Have difficulty breathing or very bad pain in your chest or abdomen. These symptoms may be an emergency. Get help right away. Call 911. Do not wait to see if the symptoms will go away. Do not drive yourself to the hospital. Summary Upper endoscopy is a procedure to look inside the upper GI tract. During the procedure, an IV will be inserted into one of your veins. You may be given a medicine to help you relax. The endoscope will be passed through your mouth and down your esophagus. Follow instructions from your health care provider about what you can eat and drink. This information is not intended to replace advice given to you by your health care provider. Make sure you discuss any questions you have with your health care provider.  Document Revised: 12/03/2021 Document Reviewed: 12/03/2021 Elsevier Patient Education  2024 Elsevier Inc.   Monitored Anesthesia Care Anesthesia refers to the techniques, procedures, and medicines that help a person stay safe and comfortable during surgery. Monitored anesthesia care, or sedation, is one type of anesthesia. You may have sedation if you do not need to be asleep for your procedure. Procedures that use sedation may include: Surgery to remove cataracts from your eyes. A dental procedure. A biopsy. This is when a tissue sample is removed and looked at under a microscope. You will be watched closely during your procedure. Your level of sedation or type of anesthesia may be changed to fit your needs. Tell a health care provider about: Any allergies you have. All medicines you are  taking, including vitamins, herbs, eye drops, creams, and over-the-counter medicines. Any problems you or family members have had with anesthesia. Any bleeding problems you have. Any surgeries you have had. Any medical conditions or illnesses you have. This includes sleep apnea, cough, fever, or the flu. Whether you are pregnant or may be pregnant. Whether you use cigarettes, alcohol, or drugs. Any use of steroids, whether by mouth or as a cream. What are the risks? Your health care provider will talk with you about risks. These may include: Getting too much medicine (oversedation). Nausea. Allergic reactions to medicines. Trouble breathing. If this happens, a breathing tube may be used to help you breathe. It will be removed when you are awake and breathing on your own. Heart trouble. Lung trouble. Confusion that gets better with time (emergence delirium). What happens before the procedure? When to stop eating and drinking Follow instructions from your health care provider about what you may eat and drink. These may include: 8 hours before your procedure Stop eating most foods. Do not eat meat, fried foods, or fatty foods. Eat only light foods, such as toast or crackers. All liquids are okay except energy drinks and alcohol. 6 hours before your procedure Stop eating. Drink only clear liquids, such as water, clear fruit juice, black coffee, plain tea, and sports drinks. Do not drink energy drinks or alcohol. 2 hours before your procedure Stop drinking all liquids. You may be allowed to take medicines with small sips of water. If you do not follow your health care provider's instructions, your procedure may be delayed or canceled. Medicines Ask your health care provider about: Changing or stopping your regular medicines. These include any diabetes medicines or blood thinners you take. Taking medicines such as aspirin and ibuprofen. These medicines can thin your blood. Do not take  them unless your health care provider tells you to. Taking over-the-counter medicines, vitamins, herbs, and supplements. Testing You may have an exam or testing. You may have a blood or urine sample taken. General instructions Do not use any products that contain nicotine or tobacco for at least 4 weeks before the procedure. These products include cigarettes, chewing tobacco, and vaping devices, such as e-cigarettes. If you need help quitting, ask your health care provider. If you will be going home right after the procedure, plan to have a responsible adult: Take you home from the hospital or clinic. You will not be allowed to drive. Care for you for the time you are told. What happens during the procedure?  Your blood pressure, heart rate, breathing, level of pain, and blood oxygen level will be monitored. An IV will be inserted into one of your veins. You may be given: A sedative. This helps you relax.  Anesthesia. This will: Numb certain areas of your body. Make you fall asleep for surgery. You will be given medicines as needed to keep you comfortable. The more medicine you are given, the deeper your level of sedation will be. Your level of sedation may be changed to fit your needs. There are three levels of sedation: Mild sedation. At this level, you may feel awake and relaxed. You will be able to follow directions. Moderate sedation. At this level, you will be sleepy. You may not remember the procedure. Deep sedation. At this level, you will be asleep. You will not remember the procedure. How you get the medicines will depend on your age and the procedure. They may be given as: A pill. This may be taken by mouth (orally) or inserted into the rectum. An injection. This may be into a vein or muscle. A spray through the nose. After your procedure is over, the medicine will be stopped. The procedure may vary among health care providers and hospitals. What happens after the  procedure? Your blood pressure, heart rate, breathing rate, and blood oxygen level will be monitored until you leave the hospital or clinic. You may feel sleepy, clumsy, or nauseous. You may not remember what happened during or after the procedure. Sedation can affect you for several hours. Do not drive or use machinery until your health care provider says that it is safe. This information is not intended to replace advice given to you by your health care provider. Make sure you discuss any questions you have with your health care provider. Document Revised: 01/19/2022 Document Reviewed: 01/19/2022 Elsevier Patient Education  2024 [employer].

## 2023-08-16 ENCOUNTER — Encounter (HOSPITAL_COMMUNITY): Payer: Self-pay

## 2023-08-16 ENCOUNTER — Encounter (HOSPITAL_COMMUNITY)
Admission: RE | Admit: 2023-08-16 | Discharge: 2023-08-16 | Disposition: A | Discharge status: Home / Self Care | Payer: PPO | Source: Ambulatory Visit | Attending: Internal Medicine | Admitting: Internal Medicine

## 2023-08-16 VITALS — BP 134/78 | HR 93 | Temp 97.8°F | Resp 18 | Ht 71.0 in | Wt 155.4 lb

## 2023-08-16 DIAGNOSIS — I10 Essential (primary) hypertension: Secondary | ICD-10-CM | POA: Diagnosis not present

## 2023-08-16 DIAGNOSIS — Z01810 Encounter for preprocedural cardiovascular examination: Secondary | ICD-10-CM | POA: Insufficient documentation

## 2023-08-16 DIAGNOSIS — R9431 Abnormal electrocardiogram [ECG] [EKG]: Secondary | ICD-10-CM | POA: Diagnosis not present

## 2023-08-16 HISTORY — DX: Emphysema, unspecified: J43.9

## 2023-08-16 HISTORY — DX: Aortic aneurysm of unspecified site, without rupture: I71.9

## 2023-08-16 HISTORY — DX: Other specified postprocedural states: Z98.890

## 2023-08-18 ENCOUNTER — Encounter (HOSPITAL_COMMUNITY): Payer: Self-pay | Admitting: Internal Medicine

## 2023-08-18 ENCOUNTER — Ambulatory Visit (HOSPITAL_COMMUNITY): Payer: Self-pay | Admitting: Certified Registered Nurse Anesthetist

## 2023-08-18 ENCOUNTER — Ambulatory Visit (HOSPITAL_BASED_OUTPATIENT_CLINIC_OR_DEPARTMENT_OTHER): Payer: PPO | Admitting: Certified Registered Nurse Anesthetist

## 2023-08-18 ENCOUNTER — Encounter (HOSPITAL_COMMUNITY)
Admission: RE | Disposition: A | Discharge status: Home / Self Care | Payer: Self-pay | Source: Home / Self Care | Attending: Internal Medicine

## 2023-08-18 ENCOUNTER — Ambulatory Visit (HOSPITAL_COMMUNITY)
Admission: RE | Admit: 2023-08-18 | Discharge: 2023-08-18 | Disposition: A | Discharge status: Home / Self Care | Payer: PPO | Attending: Internal Medicine | Admitting: Internal Medicine

## 2023-08-18 DIAGNOSIS — R6881 Early satiety: Secondary | ICD-10-CM | POA: Insufficient documentation

## 2023-08-18 DIAGNOSIS — K2950 Unspecified chronic gastritis without bleeding: Secondary | ICD-10-CM | POA: Insufficient documentation

## 2023-08-18 DIAGNOSIS — F17210 Nicotine dependence, cigarettes, uncomplicated: Secondary | ICD-10-CM | POA: Insufficient documentation

## 2023-08-18 DIAGNOSIS — K3189 Other diseases of stomach and duodenum: Secondary | ICD-10-CM

## 2023-08-18 DIAGNOSIS — J439 Emphysema, unspecified: Secondary | ICD-10-CM | POA: Diagnosis not present

## 2023-08-18 DIAGNOSIS — R634 Abnormal weight loss: Secondary | ICD-10-CM | POA: Insufficient documentation

## 2023-08-18 DIAGNOSIS — Z6821 Body mass index (BMI) 21.0-21.9, adult: Secondary | ICD-10-CM | POA: Insufficient documentation

## 2023-08-18 DIAGNOSIS — Z8619 Personal history of other infectious and parasitic diseases: Secondary | ICD-10-CM | POA: Insufficient documentation

## 2023-08-18 DIAGNOSIS — K259 Gastric ulcer, unspecified as acute or chronic, without hemorrhage or perforation: Secondary | ICD-10-CM | POA: Diagnosis not present

## 2023-08-18 DIAGNOSIS — J449 Chronic obstructive pulmonary disease, unspecified: Secondary | ICD-10-CM | POA: Diagnosis not present

## 2023-08-18 HISTORY — PX: ESOPHAGOGASTRODUODENOSCOPY (EGD) WITH PROPOFOL: SHX5813

## 2023-08-18 HISTORY — PX: BIOPSY: SHX5522

## 2023-08-18 SURGERY — ESOPHAGOGASTRODUODENOSCOPY (EGD) WITH PROPOFOL
Anesthesia: General

## 2023-08-18 MED ORDER — PROPOFOL 500 MG/50ML IV EMUL
INTRAVENOUS | Status: AC
Start: 2023-08-18 — End: ?
  Filled 2023-08-18: qty 50

## 2023-08-18 MED ORDER — LIDOCAINE HCL (PF) 2 % IJ SOLN
INTRAMUSCULAR | Status: DC | PRN
  Administered 2023-08-18: 25 mg via INTRADERMAL

## 2023-08-18 MED ORDER — STERILE WATER FOR IRRIGATION IR SOLN
Status: DC | PRN
  Administered 2023-08-18: 120 mL

## 2023-08-18 MED ORDER — PROPOFOL 10 MG/ML IV BOLUS
INTRAVENOUS | Status: DC | PRN
  Administered 2023-08-18: 60 mg via INTRAVENOUS
  Administered 2023-08-18: 180 ug/kg/min via INTRAVENOUS

## 2023-08-18 MED ORDER — LACTATED RINGERS IV SOLN: INTRAVENOUS | Status: DC

## 2023-08-18 MED ORDER — LIDOCAINE HCL (PF) 2 % IJ SOLN
INTRAMUSCULAR | Status: AC
  Filled 2023-08-18: qty 5

## 2023-08-18 NOTE — Anesthesia Preprocedure Evaluation (Signed)
Anesthesia Evaluation  Patient identified by MRN, date of birth, ID band Patient awake    Reviewed: Allergy & Precautions, H&P , NPO status , Patient's Chart, lab work & pertinent test results, reviewed documented beta blocker date and time   Airway Mallampati: II  TM Distance: >3 FB Neck ROM: full    Dental no notable dental hx.    Pulmonary neg pulmonary ROS, COPD, Current Smoker   Pulmonary exam normal breath sounds clear to auscultation       Cardiovascular Exercise Tolerance: Good hypertension, negative cardio ROS  Rhythm:regular Rate:Normal     Neuro/Psych negative neurological ROS  negative psych ROS   GI/Hepatic negative GI ROS, Neg liver ROS,,,  Endo/Other  negative endocrine ROS    Renal/GU negative Renal ROS  negative genitourinary   Musculoskeletal   Abdominal   Peds  Hematology negative hematology ROS (+) Blood dyscrasia, anemia   Anesthesia Other Findings   Reproductive/Obstetrics negative OB ROS                             Anesthesia Physical Anesthesia Plan  ASA: 3  Anesthesia Plan: General   Post-op Pain Management:    Induction:   PONV Risk Score and Plan: Propofol infusion  Airway Management Planned:   Additional Equipment:   Intra-op Plan:   Post-operative Plan:   Informed Consent: I have reviewed the patients History and Physical, chart, labs and discussed the procedure including the risks, benefits and alternatives for the proposed anesthesia with the patient or authorized representative who has indicated his/her understanding and acceptance.     Dental Advisory Given  Plan Discussed with: CRNA  Anesthesia Plan Comments:        Anesthesia Quick Evaluation

## 2023-08-18 NOTE — H&P (Signed)
@LOGO @   Primary Care Physician:  Dettinger, Elige Radon, MD Primary Gastroenterologist:  Dr. Jena Gauss  Pre-Procedure History & Physical: HPI:  Spencer Miller. is a 77 y.o. male here for EGD to further evaluate early satiety weight loss history of H. pylori treated but eradication not proven.  Denies dysphagia.  Past Medical History:  Diagnosis Date   Aortic aneurysm (HCC)    measuring 4.2   Cataract    Bilateral    COPD (chronic obstructive pulmonary disease) (HCC)    COPD (chronic obstructive pulmonary disease) (HCC)    Emphysema lung (HCC)    H. pylori infection    bismuth quadruple therapy with tetracycline, metronidazole, pantoprazole twice daily   History of brain surgery    SCCA (squamous cell carcinoma) of skin 05/23/2013   Right Cheek(well diff) (curet and 5FU)   SCCA (squamous cell carcinoma) of skin 11/26/2015   Left Cheek(well diff) (curet and 5FU)    Past Surgical History:  Procedure Laterality Date   CATARACT EXTRACTION, BILATERAL     FRACTURE SURGERY     HERNIA REPAIR      Prior to Admission medications   Medication Sig Start Date End Date Taking? Authorizing Provider  alfuzosin (UROXATRAL) 10 MG 24 hr tablet Take 1 tablet (10 mg total) by mouth at bedtime. 07/30/23  Yes McKenzie, Mardene Celeste, MD  pantoprazole (PROTONIX) 40 MG tablet Take 1 tablet (40 mg total) by mouth daily. 07/07/23  Yes Letta Median, PA-C    Allergies as of 07/08/2023   (No Known Allergies)    Family History  Problem Relation Age of Onset   Obesity Mother    Melanoma Father    Colon cancer Sister        early 85s   Obesity Sister    Obesity Sister    Obesity Sister    Cancer Brother        stomach   Stomach cancer Brother    Healthy Son    Healthy Son    Healthy Son    Healthy Son     Social History   Socioeconomic History   Marital status: Married    Spouse name: Rinaldo Cloud   Number of children: 4   Years of education: Not on file   Highest education level: Not on  file  Occupational History    Comment: Retired [employer] 25 years    Comment: Retired [employer] 15 years  Tobacco Use   Smoking status: Every Day    Current packs/day: 1.00    Average packs/day: 1 pack/day for 56.9 years (56.9 ttl pk-yrs)    Types: Cigarettes    Start date: 1968   Smokeless tobacco: Never  Vaping Use   Vaping status: Never Used  Substance and Sexual Activity   Alcohol use: Yes    Alcohol/week: 14.0 standard drinks of alcohol    Types: 14 Cans of beer per week   Drug use: No   Sexual activity: Not Currently  Other Topics Concern   Not on file  Social History Narrative   Married x 43 in 2022.   1 grandchild   Social Determinants of Health   Financial Resource Strain: Low Risk  (09/24/2021)   Overall Financial Resource Strain (CARDIA)    Difficulty of Paying Living Expenses: Not hard at all  Food Insecurity: No Food Insecurity (09/24/2021)   Hunger Vital Sign    Worried About Running Out of Food in the Last Year: Never true  Ran Out of Food in the Last Year: Never true  Transportation Needs: No Transportation Needs (09/24/2021)   PRAPARE - Administrator, Civil Service (Medical): No    Lack of Transportation (Non-Medical): No  Physical Activity: Sufficiently Active (09/24/2021)   Exercise Vital Sign    Days of Exercise per Week: 5 days    Minutes of Exercise per Session: 30 min  Stress: No Stress Concern Present (09/24/2021)   [employer] of Occupational Health - Occupational Stress Questionnaire    Feeling of Stress : Only a little  Social Connections: Moderately Integrated (09/24/2021)   Social Connection and Isolation Panel [NHANES]    Frequency of Communication with Friends and Family: More than three times a week    Frequency of Social Gatherings with Friends and Family: Once a week    Attends Religious Services: 1 to 4 times per year    Active Member of [employer] or Organizations: No    Attends Banker Meetings:  Never    Marital Status: Married  Catering manager Violence: Not At Risk (09/24/2021)   Humiliation, Afraid, Rape, and Kick questionnaire    Fear of Current or Ex-Partner: No    Emotionally Abused: No    Physically Abused: No    Sexually Abused: No    Review of Systems: See HPI, otherwise negative ROS  Physical Exam: BP 131/63   Pulse 72   Temp 97.8 F (36.6 C) (Oral)   Resp 16   SpO2 95%  General:   Alert,  Well-developed, well-nourished, pleasant and cooperative in NAD Neck:  Supple; no masses or thyromegaly. No significant cervical adenopathy. Lungs:  Clear throughout to auscultation.   No wheezes, crackles, or rhonchi. No acute distress. Heart:  Regular rate and rhythm; no murmurs, clicks, rubs,  or gallops. Abdomen: Non-distended, normal bowel sounds.  Soft and nontender without appreciable mass or hepatosplenomegaly.   Impression/Plan: Early satiety weight loss.  History of H. pylori treated but eradication not proven.  No dysphagia.  Diagnostic EGD today per plan. The risks, benefits, limitations, alternatives and imponderables have been reviewed with the patient. Potential for esophageal dilation, biopsy, etc. have also been reviewed.  Questions have been answered. All parties agreeable.      Notice: This dictation was prepared with Dragon dictation along with smaller phrase technology. Any transcriptional errors that result from this process are unintentional and may not be corrected upon review.

## 2023-08-18 NOTE — Discharge Instructions (Signed)
EGD Discharge instructions Please read the instructions outlined below and refer to this sheet in the next few weeks. These discharge instructions provide you with general information on caring for yourself after you leave the hospital. Your doctor may also give you specific instructions. While your treatment has been planned according to the most current medical practices available, unavoidable complications occasionally occur. If you have any problems or questions after discharge, please call your doctor. ACTIVITY You may resume your regular activity but move at a slower pace for the next 24 hours.  Take frequent rest periods for the next 24 hours.  Walking will help expel (get rid of) the air and reduce the bloated feeling in your abdomen.  No driving for 24 hours (because of the anesthesia (medicine) used during the test).  You may shower.  Do not sign any important legal documents or operate any machinery for 24 hours (because of the anesthesia used during the test).  NUTRITION Drink plenty of fluids.  You may resume your normal diet.  Begin with a light meal and progress to your normal diet.  Avoid alcoholic beverages for 24 hours or as instructed by your caregiver.  MEDICATIONS You may resume your normal medications unless your caregiver tells you otherwise.  WHAT YOU CAN EXPECT TODAY You may experience abdominal discomfort such as a feeling of fullness or "gas" pains.  FOLLOW-UP Your doctor will discuss the results of your test with you.  SEEK IMMEDIATE MEDICAL ATTENTION IF ANY OF THE FOLLOWING OCCUR: Excessive nausea (feeling sick to your stomach) and/or vomiting.  Severe abdominal pain and distention (swelling).  Trouble swallowing.  Temperature over 101 F (37.8 C).  Rectal bleeding or vomiting of blood.    Stomach inflamed.  Biopsies taken.  Further recommendations to follow pending review of pathology report  At patient request, I called Pam at [phone]-

## 2023-08-18 NOTE — Transfer of Care (Signed)
Immediate Anesthesia Transfer of Care Note  Patient: Spencer Miller.  Procedure(s) Performed: ESOPHAGOGASTRODUODENOSCOPY (EGD) WITH PROPOFOL BIOPSY  Patient Location: Short Stay  Anesthesia Type:General  Level of Consciousness: awake, alert , and oriented  Airway & Oxygen Therapy: Patient Spontanous Breathing  Post-op Assessment: Report given to RN, Post -op Vital signs reviewed and stable, Patient moving all extremities X 4, and Patient able to stick tongue midline  Post vital signs: Reviewed and stable  Last Vitals:  Vitals Value Taken Time  BP 90/49   Temp 98.3   Pulse 71   Resp 16   SpO2 100     Last Pain:  Vitals:   08/18/23 0929  TempSrc:   PainSc: 0-No pain      Patients Stated Pain Goal: 5 (08/18/23 1478)  Complications: No notable events documented.

## 2023-08-18 NOTE — Op Note (Signed)
[facility] Patient Name: Spencer Miller Procedure Date: 08/18/2023 9:06 AM MRN: [id number] Date of Birth: 05/28/1946 Attending MD: Gennette Pac , MD, [id number] CSN: [id number] Age: 77 Admit Type: Outpatient Procedure:                Upper GI endoscopy Indications:              Early satiety, weight loss Providers:                Gennette Pac, MD, Francoise Ceo RN, RN,                            Lennice Sites Technician, Technician Referring MD:              Medicines:                Propofol per Anesthesia Complications:            No immediate complications. Estimated Blood Loss:     Estimated blood loss was minimal. Procedure:                Pre-Anesthesia Assessment:                           - Prior to the procedure, a History and Physical                            was performed, and patient medications and                            allergies were reviewed. The patient's tolerance of                            previous anesthesia was also reviewed. The risks                            and benefits of the procedure and the sedation                            options and risks were discussed with the patient.                            All questions were answered, and informed consent                            was obtained. Prior Anticoagulants: The patient has                            taken no anticoagulant or antiplatelet agents. ASA                            Grade Assessment: III - A patient with severe                            systemic disease. After reviewing the risks and  benefits, the patient was deemed in satisfactory                            condition to undergo the procedure.                           After obtaining informed consent, the endoscope was                            passed under direct vision. Throughout the                            procedure, the patient's blood pressure, pulse, and                             oxygen saturations were monitored continuously. The                            [id number]-[id number] ([id number]) scope was introduced through the                            mouth, and advanced to the second part of duodenum.                            The upper GI endoscopy was accomplished without                            difficulty. The patient tolerated the procedure                            well. Scope In: 9:35:00 AM Scope Out: 9:40:40 AM Total Procedure Duration: 0 hours 5 minutes 40 seconds  Findings:      The examined esophagus was normal. Gastric cavity empty. Patient had       erythema and scattered superficial erosions no ulcer or infiltrating       process. Patent pylorus.      The duodenal bulb and second portion of the duodenum were normal.       Biopsies of the gastric antrum and body taken for histologic study Impression:               - Normal esophagus. Gastric erythema and erosions.                            Status post biopsy.                           - Normal duodenal bulb and second portion of the                            duodenum.                           - Moderate Sedation:      Moderate (conscious) sedation was personally administered by an       anesthesia professional. The following parameters were monitored: oxygen  saturation, heart rate, blood pressure, respiratory rate, EKG, adequacy       of pulmonary ventilation, and response to care. Recommendation:           - Patient has a contact number available for                            emergencies. The signs and symptoms of potential                            delayed complications were discussed with the                            patient. Return to normal activities tomorrow.                            Written discharge instructions were provided to the                            patient.                           - Advance diet as tolerated. Follow-up on                            pathology.  Further recommendations to follow. Procedure Code(s):        --- Professional ---                           [phone], Esophagogastroduodenoscopy, flexible,                            transoral; diagnostic, including collection of                            specimen(s) by brushing or washing, when performed                            (separate procedure) Diagnosis Code(s):        --- Professional ---                           R68.81, Early satiety CPT copyright 2022 American Medical Association. All rights reserved. The codes documented in this report are preliminary and upon coder review may  be revised to meet current compliance requirements. [name]. [name], MD Gennette Pac, MD 08/18/2023 9:54:41 AM This report has been signed electronically. Number of Addenda: 0

## 2023-08-19 ENCOUNTER — Encounter: Payer: Self-pay | Admitting: Internal Medicine

## 2023-08-19 LAB — SURGICAL PATHOLOGY

## 2023-08-19 NOTE — Anesthesia Postprocedure Evaluation (Signed)
Anesthesia Post Note  Patient: Spencer Miller.  Procedure(s) Performed: ESOPHAGOGASTRODUODENOSCOPY (EGD) WITH PROPOFOL BIOPSY  Patient location during evaluation: Phase II Anesthesia Type: General Level of consciousness: awake Pain management: pain level controlled Vital Signs Assessment: post-procedure vital signs reviewed and stable Respiratory status: spontaneous breathing and respiratory function stable Cardiovascular status: blood pressure returned to baseline and stable Postop Assessment: no headache and no apparent nausea or vomiting Anesthetic complications: no Comments: Late entry   No notable events documented.   Last Vitals:  Vitals:   08/18/23 0945 08/18/23 0954  BP: (!) 90/49 119/65  Pulse: 71   Resp: 16   Temp: 36.8 C   SpO2: 99%     Last Pain:  Vitals:   08/19/23 1444  TempSrc:   PainSc: 0-No pain                 Windell Norfolk

## 2023-08-25 ENCOUNTER — Encounter (HOSPITAL_COMMUNITY): Payer: Self-pay | Admitting: Internal Medicine

## 2023-09-03 NOTE — Progress Notes (Deleted)
      [address]       [place] [address]             [phone]        Spencer Miller [id number] 09-03-1946   History of Present Illness:       Current Outpatient Medications on File Prior to Visit  Medication Sig Dispense Refill   alfuzosin (UROXATRAL) 10 MG 24 hr tablet Take 1 tablet (10 mg total) by mouth at bedtime. 30 tablet 11   pantoprazole (PROTONIX) 40 MG tablet Take 1 tablet (40 mg total) by mouth daily. 30 tablet 1   No current facility-administered medications on file prior to visit.   Vitals:  Physical Exam  CTA Results:        Impression and Plan: *** with a *** cm ascending aortic aneurysm.  Echocardiogram shows a *** valve without evidence of regurgitation.  We discussed the natural history and and risk factors for growth of ascending aortic aneurysms.  We covered the importance of smoking cessation, tight blood pressure control, refraining from lifting heavy objects, and avoiding fluoroquinolones.  The patient is aware of signs and symptoms of aortic dissection and when to present to the emergency department.  We will continue surveillance and a repeat ** was ordered for ***.       Risk Modification:  Statin:  ***  Smoking cessation instruction/counseling given:  {CHL AMB [facility] SMOKING CESSATION COUNSELING:20758}  Patient was counseled on importance of Blood Pressure Control.  Despite Medical intervention if the patient notices persistently elevated blood pressure readings.  They are instructed to contact their Primary Care Physician  Please avoid use of Fluoroquinolones as this can potentially increase your risk of Aortic Rupture and/or Dissection  Patient educated on signs and symptoms of Aortic Dissection, handout also provided in AVS  Jenny Reichmann, PA-C 09/03/23

## 2023-09-22 ENCOUNTER — Ambulatory Visit: Payer: PPO | Admitting: Urology

## 2023-10-05 ENCOUNTER — Encounter: Payer: Self-pay | Admitting: Physician Assistant

## 2023-10-05 ENCOUNTER — Institutional Professional Consult (permissible substitution): Payer: PPO | Admitting: Physician Assistant

## 2023-10-05 VITALS — BP 144/77 | HR 86 | Resp 18 | Ht 71.0 in | Wt 155.0 lb

## 2023-10-05 DIAGNOSIS — I7121 Aneurysm of the ascending aorta, without rupture: Secondary | ICD-10-CM | POA: Diagnosis not present

## 2023-10-05 NOTE — Progress Notes (Signed)
[address]       [place] [address]             [phone]      PCP is Dettinger, Elige Radon, MD Referring Provider is Dettinger, Elige Radon, MD  Reason for consult: Evaluation and surveillance of thoracic aortic aneurysm   HPI: Mr. Cardale a Roberth Berling. is a 78 year old gentleman with past history notable for emphysema and ongoing tobacco use with greater than 40-pack-year smoking history.  He has benign prostatic hyperplasia and is involved in the [place] lung cancer screening program where he receives surveillance noncontrast chest CT scans on an annual basis.  Since 2019, he has had some dilation of the thoracic aortic aneurysm first measuring 4.0 cm in 2019, then 4.2 cm in 2020, the 2021 scan showed no aneurysmal dilation and again in 2022 was read as 3.9 cm.  In October 2024, it was again measured at 4.2 cm.  Mr. Stohr was referred to [place] for ongoing surveillance.  He has no other history of vascular disease or cardiac disease.  He has no known family history of aneurysmal disease or sudden death.   Past Medical History:  Diagnosis Date   Aortic aneurysm (HCC)    measuring 4.2   Cataract    Bilateral    COPD (chronic obstructive pulmonary disease) (HCC)    COPD (chronic obstructive pulmonary disease) (HCC)    Emphysema lung (HCC)    H. pylori infection    bismuth quadruple therapy with tetracycline, metronidazole, pantoprazole twice daily   History of brain surgery    SCCA (squamous cell carcinoma) of skin 05/23/2013   Right Cheek(well diff) (curet and 5FU)   SCCA (squamous cell carcinoma) of skin 11/26/2015   Left Cheek(well diff) (curet and 5FU)    Past Surgical History:  Procedure Laterality Date   BIOPSY  08/18/2023   Procedure: BIOPSY;  Surgeon: Corbin Ade, MD;  [facility];  Service: Endoscopy;;   CATARACT EXTRACTION, BILATERAL     ESOPHAGOGASTRODUODENOSCOPY (EGD) WITH PROPOFOL N/A 08/18/2023   Procedure:  ESOPHAGOGASTRODUODENOSCOPY (EGD) WITH PROPOFOL;  Surgeon: Corbin Ade, MD;  [facility];  Service: Endoscopy;  Laterality: N/A;  915am, asa 3   FRACTURE SURGERY     HERNIA REPAIR      Family History  Problem Relation Age of Onset   Obesity Mother    Melanoma Father    Colon cancer Sister        early 69s   Obesity Sister    Obesity Sister    Obesity Sister    Cancer Brother        stomach   Stomach cancer Brother    Healthy Son    Healthy Son    Healthy Son    Healthy Son     Social History Social History   Tobacco Use   Smoking status: Every Day    Current packs/day: 1.00    Average packs/day: 1 pack/day for 57.1 years (57.1 ttl pk-yrs)    Types: Cigarettes    Start date: 1968   Smokeless tobacco: Never  Vaping Use   Vaping status: Never Used  Substance Use Topics   Alcohol use: Yes    Alcohol/week: 14.0 standard drinks of alcohol    Types: 14 Cans of beer per week   Drug use: No    Current Outpatient Medications  Medication Sig Dispense Refill   alfuzosin (UROXATRAL) 10 MG 24 hr  tablet Take 1 tablet (10 mg total) by mouth at bedtime. 30 tablet 11   pantoprazole (PROTONIX) 40 MG tablet Take 1 tablet (40 mg total) by mouth daily. 30 tablet 1   No current facility-administered medications for this visit.    No Known Allergies  Review of Systems: Review of Systems  Constitutional: Negative.   HENT:         History of cataracts, otherwise negative  Respiratory: Negative.    Cardiovascular: Negative.   Gastrointestinal: Negative.   Genitourinary: Negative.   Musculoskeletal: Negative.   Skin: Negative.   Neurological: Negative.   Endo/Heme/Allergies: Negative.   Psychiatric/Behavioral: Negative.       There were no vitals taken for this visit. Physical Exam: Vital signs BP 144/77 Heart rate 86 Respirations 18 SpO2 90% on room air  Diagnostic Tests: CLINICAL DATA:  Current 48 pack-year smoker.   EXAM: CT CHEST WITHOUT  CONTRAST LOW-DOSE FOR LUNG CANCER SCREENING   TECHNIQUE: Multidetector CT imaging of the chest was performed following the standard protocol without IV contrast.   RADIATION DOSE REDUCTION: This exam was performed according to the departmental dose-optimization program which includes automated exposure control, adjustment of the mA and/or kV according to patient size and/or use of iterative reconstruction technique.   COMPARISON:  07/02/2022.   FINDINGS: Cardiovascular: Atherosclerotic calcification of the aorta, aortic valve and coronary arteries. Ascending aorta measures 4.2 cm (5/115) stable. Heart size normal. No pericardial effusion.   Mediastinum/Nodes: No pathologically enlarged mediastinal or axillary lymph nodes. Hilar regions are difficult to definitively evaluate without IV contrast. Esophagus is grossly unremarkable.   Lungs/Pleura: Centrilobular emphysema. Pulmonary nodules measure 6.4 mm or less in size as before. Cylindrical bronchiectasis. Debris in the airway and right lower lobe bronchus. No pleural fluid.   Upper Abdomen: Right hepatic lobe cyst. No specific follow-up necessary. Visualized portions of the liver, gallbladder, adrenal glands, kidneys, spleen, pancreas, stomach and bowel are otherwise grossly unremarkable. No upper abdominal adenopathy.   Musculoskeletal: Degenerative changes in the spine. Osteopenia. Old L1 compression fracture.   IMPRESSION: 1. Lung-RADS 2, benign appearance or behavior. Continue annual screening with low-dose chest CT without contrast in 12 months. 2. Cylindrical bronchiectasis. 3. 4.2 cm ascending aortic aneurysm, stable. Recommend annual imaging followup by CTA or MRA. This recommendation follows 2010 ACCF/AHA/AATS/ACR/ASA/SCA/SCAI/SIR/STS/SVM Guidelines for the Diagnosis and Management of Patients with Thoracic Aortic Disease. Circulation. 2010; 121: Z610-R604. Aortic aneurysm NOS (ICD10-I71.9). 4. Aortic  atherosclerosis (ICD10-I70.0). Coronary artery calcification. 5.  Emphysema (ICD10-J43.9).     Electronically Signed   By: Leanna Battles M.D.   On: 08/02/2023 16:13        Impression / Plan: Relatively stable 4.2 cm thoracic aortic aneurysm and a 78 year old gentleman with medical history tobacco use, pulmonary nodules, emphysema, and benign prostatic hyperplasia.  We discussed the importance of ongoing surveillance, avoidance of the quinolone class of antibiotics, and avoidance of strenuous activity, and recommendation for carefully managing his blood pressure with a goal of less than 130/90.  Mr. Mayol has consistently had a normal lipid profile so he does not take a statin agent.  He and his wife expressed understanding.  Will schedule follow-up with CTA in 1 year.   Leary Roca, PA-C [facility] [phone]

## 2023-10-05 NOTE — Patient Instructions (Signed)
Risk Modification for patients with ascending thoracic aortic aneurysm:  Continue good control of blood pressure (goal blood pressure BP 130/80 or less)  2. Avoid fluoroquinolone antibiotics (I.e Ciprofloxacin, Avelox, Levofloxacin, Ofloxacin) as these have been shown to weaken connective tissue and increase the risk for aneurysmal expansion or rupture.  3.  Use of statin   4.  Exercise and activity limitations is individualized, but in general, contact sports are to be  avoided and one should avoid heavy lifting (defined as half of ideal body weight) and exercises involving sustained Valsalva maneuver.  5.  Follow-up in 1 year with CTA chest

## 2023-10-12 NOTE — Progress Notes (Signed)
 Referring Provider: Dettinger, Fonda LABOR, MD Primary Care Physician:  Dettinger, Fonda LABOR, MD Primary GI Physician: Dr. Shaaron  Chief Complaint  Patient presents with   Follow-up    Follow up after procedure     HPI:   Spencer Miller. is a 78 y.o. male presenting today for follow-up of right sided abdominal pain/rib pain, early satiety, weight loss.   Initially seen in September 2024 reporting 1 month of RUQ abdominal pain affecting his appetite with associated weight loss.  Also with constipation requiring laxatives.  Patient requested least invasive testing.   H. pylori stool antigen was + 05/26/2023 and he was treated with bismuth  quadruple therapy with tetracycline , metronidazole , pantoprazole , bismuth .  RUQ ultrasound 05/26/2023 with a 5.2 cm cyst on the liver, fatty liver, no acute abnormalities.  At his last visit on 07/07/2023, he reported RUQ pain greatly improved since H. pylori treatment, but had not resolved.  Seem to be more present the latter part of the evening and with certain activities.  When patient redemonstrated area of pain, he pointed to the right lower rib cage rather than his abdomen.  Denied postprandial symptoms though he continued with early satiety.  Bowel habits had normalized.  He was not needing MiraLAX.  Weight had been stable since his prior visit.  Queried whether patient had 2 separate issues going on, MSK etiology related to rib pain versus other GI etiology contributing to early satiety and prior weight loss.  He was scheduled for an upper endoscopy for further evaluation.  EGD 08/18/2023: Normal esophagus, gastric erythema and erosions biopsied, normal examined duodenum.  Pathology showed chronic inflammation with goblet cells and Paneth cells, negative for H. pylori.  Comment stated this could represent intestinal metaplasia involving gastric mucosa or duodenal mucosa with peptic changes.  Today: Reports he is feeling great, back to normal. All  pain has resolved. Eating well. Has gained weight back.  No nausea or vomiting.  Bowels are moving normally, every 2 days.  No BRBPR or melena.   Past Medical History:  Diagnosis Date   Aortic aneurysm (HCC)    measuring 4.2   Cataract    Bilateral    COPD (chronic obstructive pulmonary disease) (HCC)    COPD (chronic obstructive pulmonary disease) (HCC)    Emphysema lung (HCC)    H. pylori infection    bismuth  quadruple therapy with tetracycline , metronidazole , pantoprazole  twice daily   History of brain surgery    SCCA (squamous cell carcinoma) of skin 05/23/2013   Right Cheek(well diff) (curet and 5FU)   SCCA (squamous cell carcinoma) of skin 11/26/2015   Left Cheek(well diff) (curet and 5FU)    Past Surgical History:  Procedure Laterality Date   BIOPSY  08/18/2023   Procedure: BIOPSY;  Surgeon: Shaaron Lamar HERO, MD;  [facility];  Service: Endoscopy;;   CATARACT EXTRACTION, BILATERAL     ESOPHAGOGASTRODUODENOSCOPY (EGD) WITH PROPOFOL  N/A 08/18/2023   Procedure: ESOPHAGOGASTRODUODENOSCOPY (EGD) WITH PROPOFOL ;  Surgeon: Shaaron Lamar HERO, MD;  [facility];  Service: Endoscopy;  Laterality: N/A;  915am, asa 3   FRACTURE SURGERY     HERNIA REPAIR      Current Outpatient Medications  Medication Sig Dispense Refill   alfuzosin  (UROXATRAL ) 10 MG 24 hr tablet Take 1 tablet (10 mg total) by mouth at bedtime. 30 tablet 11   No current facility-administered medications for this visit.    Allergies as of 10/14/2023   (No Known Allergies)  Family History  Problem Relation Age of Onset   Obesity Mother    Melanoma Father    Colon cancer Sister        early 42s   Obesity Sister    Obesity Sister    Obesity Sister    Cancer Brother        stomach   Stomach cancer Brother    Healthy Son    Healthy Son    Healthy Son    Healthy Son     Social History   Socioeconomic History   Marital status: Married    Spouse name: Sharlet   Number of  children: 4   Years of education: Not on file   Highest education level: Not on file  Occupational History    Comment: Retired [employer] 25 years    Comment: Retired [employer] 15 years  Tobacco Use   Smoking status: Every Day    Current packs/day: 1.00    Average packs/day: 1 pack/day for 57.1 years (57.1 ttl pk-yrs)    Types: Cigarettes    Start date: 1968   Smokeless tobacco: Never  Vaping Use   Vaping status: Never Used  Substance and Sexual Activity   Alcohol use: Yes    Alcohol/week: 14.0 standard drinks of alcohol    Types: 14 Cans of beer per week   Drug use: No   Sexual activity: Not Currently  Other Topics Concern   Not on file  Social History Narrative   Married x 43 in 2022.   1 grandchild   Social Drivers of Corporate Investment Banker Strain: Low Risk  (09/24/2021)   Overall Financial Resource Strain (CARDIA)    Difficulty of Paying Living Expenses: Not hard at all  Food Insecurity: No Food Insecurity (09/24/2021)   Hunger Vital Sign    Worried About Running Out of Food in the Last Year: Never true    Ran Out of Food in the Last Year: Never true  Transportation Needs: No Transportation Needs (09/24/2021)   PRAPARE - Administrator, Civil Service (Medical): No    Lack of Transportation (Non-Medical): No  Physical Activity: Sufficiently Active (09/24/2021)   Exercise Vital Sign    Days of Exercise per Week: 5 days    Minutes of Exercise per Session: 30 min  Stress: No Stress Concern Present (09/24/2021)   [employer] of Occupational Health - Occupational Stress Questionnaire    Feeling of Stress : Only a little  Social Connections: Moderately Integrated (09/24/2021)   Social Connection and Isolation Panel [NHANES]    Frequency of Communication with Friends and Family: More than three times a week    Frequency of Social Gatherings with Friends and Family: Once a week    Attends Religious Services: 1 to 4 times per year    Active  Member of [employer] or Organizations: No    Attends Engineer, Structural: Never    Marital Status: Married    Review of Systems: Gen: Denies fever, chills, cold or flulike symptoms, presyncope, syncope. GI: See HPI Heme: See HPI  Physical Exam: BP 136/76 (BP Location: Right Arm, Patient Position: Sitting, Cuff Size: Large)   Pulse 86   Temp 98 F (36.7 C) (Temporal)   Ht 5' 11 (1.803 m)   Wt 162 lb 12.8 oz (73.8 kg)   BMI 22.71 kg/m  General:   Alert and oriented. No distress noted. Pleasant and cooperative.  Head:  Normocephalic and atraumatic. Eyes:  Conjuctiva clear without scleral icterus. Heart:  S1, S2 present without murmurs appreciated. Lungs:  Clear to auscultation bilaterally. No wheezes, rales, or rhonchi. No distress.  Abdomen:  +BS, soft, non-tender and non-distended. No rebound or guarding. No HSM or masses noted. Msk:  Symmetrical without gross deformities. Normal posture. Extremities:  With 2+ bilateral lower extremity pitting edema. Neurologic:  Alert and  oriented x4 Psych:  Normal mood and affect.    Assessment:  78 year old male presenting today for follow-up of abdominal pain/rib pain, early satiety, weight loss.  He was found to have H. pylori in September 2024 via stool antigen and treated with bismuth  quadruple therapy.  RUQ ultrasound September 2024 with 5.2 cm cyst on the liver, fatty liver.  EGD 08/18/2023 with gastric erythema and erosions, biopsies with chronic inflammation with goblet cells and Paneth cells, negative for H. pylori.  Comment stated this could represent intestinal metaplasia involving gastric mucosa or duodenal mucosa with peptic changes.  Since H. pylori treatment and completing a course of daily PPI thereafter, patient has had resolution of all GI symptoms and has gained his weight back.  He is no longer taking pantoprazole  and is feeling great. Rib pain is also resolved though I suspect that was unrelated to his GI symptoms.      Plan:  Follow-up prn.    Josette Centers, PA-C [facility] 10/14/2023

## 2023-10-14 ENCOUNTER — Encounter: Payer: Self-pay | Admitting: Gastroenterology

## 2023-10-14 ENCOUNTER — Ambulatory Visit: Payer: PPO | Admitting: Gastroenterology

## 2023-10-14 VITALS — BP 136/76 | HR 86 | Temp 98.0°F | Ht 71.0 in | Wt 162.8 lb

## 2023-10-14 DIAGNOSIS — Z8619 Personal history of other infectious and parasitic diseases: Secondary | ICD-10-CM

## 2023-10-14 DIAGNOSIS — Z09 Encounter for follow-up examination after completed treatment for conditions other than malignant neoplasm: Secondary | ICD-10-CM

## 2023-10-14 NOTE — Patient Instructions (Signed)
 H. pylori has been eradicated.  I am glad you are feeling much better!  We will plan to see you back as needed.  Do not hesitate to call if you develop any new GI concerns.  Shana Daring, PA-C [facility]

## 2023-11-01 ENCOUNTER — Ambulatory Visit (INDEPENDENT_AMBULATORY_CARE_PROVIDER_SITE_OTHER): Payer: PPO | Admitting: Family Medicine

## 2023-11-01 VITALS — BP 125/66 | HR 79 | Ht 71.0 in | Wt 166.0 lb

## 2023-11-01 DIAGNOSIS — F17200 Nicotine dependence, unspecified, uncomplicated: Secondary | ICD-10-CM | POA: Diagnosis not present

## 2023-11-01 DIAGNOSIS — N401 Enlarged prostate with lower urinary tract symptoms: Secondary | ICD-10-CM

## 2023-11-01 DIAGNOSIS — N3943 Post-void dribbling: Secondary | ICD-10-CM | POA: Diagnosis not present

## 2023-11-01 DIAGNOSIS — J432 Centrilobular emphysema: Secondary | ICD-10-CM | POA: Diagnosis not present

## 2023-11-01 MED ORDER — ALBUTEROL SULFATE HFA 108 (90 BASE) MCG/ACT IN AERS: 2.0000 | INHALATION_SPRAY | Freq: Four times a day (QID) | RESPIRATORY_TRACT | 0 refills | Status: DC | PRN

## 2023-11-01 NOTE — Progress Notes (Signed)
 BP 125/66   Pulse 79   Ht 5\' 11"  (1.803 m)   Wt 166 lb (75.3 kg)   SpO2 94%   BMI 23.15 kg/m    Subjective:   Patient ID: Spencer Lion., male    DOB: 1945/10/12, 78 y.o.   MRN: [id number]  HPI: Spencer Maybee. is a 78 y.o. male presenting on 11/01/2023 for Medical Management of Chronic Issues   HPI COPD Patient is coming in for COPD recheck today.  He is currently on no medical action.  He has a mild chronic cough but denies any major coughing spells or wheezing spells.  He has 1 nighttime symptoms per week and has been 1 daytime symptoms per week currently.  Patient is still smoking and has no desire to  BPH Patient is coming in for recheck on BPH Symptoms: Urinary frequency and nocturia Medication: Alfuzosin Last PSA:  6 months ago, 1.8   Relevant past medical, surgical, family and social history reviewed and updated as indicated. Interim medical history since our last visit reviewed. Allergies and medications reviewed and updated.  Review of Systems  Constitutional:  Negative for chills and fever.  Eyes:  Negative for visual disturbance.  Respiratory:  Positive for cough and shortness of breath. Negative for wheezing.   Cardiovascular:  Negative for chest pain and leg swelling.  Musculoskeletal:  Negative for back pain and gait problem.  Skin:  Negative for rash.  All other systems reviewed and are negative.   Per HPI unless specifically indicated above   Allergies as of 11/01/2023   No Known Allergies      Medication List        Accurate as of November 01, 2023  3:46 PM. If you have any questions, ask your nurse or doctor.          albuterol 108 (90 Base) MCG/ACT inhaler Commonly known as: VENTOLIN HFA Inhale 2 puffs into the lungs every 6 (six) hours as needed for wheezing or shortness of breath. Started by: Elige Radon Kieanna Rollo   alfuzosin 10 MG 24 hr tablet Commonly known as: UROXATRAL Take 1 tablet (10 mg total) by mouth at bedtime.          Objective:   BP 125/66   Pulse 79   Ht 5\' 11"  (1.803 m)   Wt 166 lb (75.3 kg)   SpO2 94%   BMI 23.15 kg/m   Wt Readings from Last 3 Encounters:  11/01/23 166 lb (75.3 kg)  10/14/23 162 lb 12.8 oz (73.8 kg)  10/05/23 155 lb (70.3 kg)    Physical Exam Vitals and nursing note reviewed.  Constitutional:      General: He is not in acute distress.    Appearance: He is well-developed. He is not diaphoretic.  HENT:     Right Ear: Tympanic membrane, ear canal and external ear normal.     Left Ear: Tympanic membrane, ear canal and external ear normal.     Nose: Mucosal edema and rhinorrhea present.     Right Sinus: Maxillary sinus tenderness present. No frontal sinus tenderness.     Left Sinus: Maxillary sinus tenderness present. No frontal sinus tenderness.     Mouth/Throat:     Pharynx: Uvula midline. No oropharyngeal exudate or posterior oropharyngeal erythema.     Tonsils: No tonsillar abscesses.  Eyes:     General: No scleral icterus.       Right eye: No discharge.     Conjunctiva/sclera: Conjunctivae normal.  Pupils: Pupils are equal, round, and reactive to light.  Neck:     Thyroid: No thyromegaly.  Cardiovascular:     Rate and Rhythm: Normal rate and regular rhythm.     Heart sounds: Normal heart sounds. No murmur heard. Pulmonary:     Effort: Pulmonary effort is normal. No respiratory distress.     Breath sounds: Normal breath sounds. No wheezing, rhonchi or rales.  Musculoskeletal:        General: Normal range of motion.     Cervical back: Neck supple.  Lymphadenopathy:     Cervical: No cervical adenopathy.  Skin:    General: Skin is warm and dry.     Findings: No rash.  Neurological:     Mental Status: He is alert and oriented to person, place, and time.     Coordination: Coordination normal.  Psychiatric:        Behavior: Behavior normal.       Assessment & Plan:   Problem List Items Addressed This Visit       Respiratory   Emphysema  of lung (HCC) - Primary   Relevant Medications   albuterol (VENTOLIN HFA) 108 (90 Base) MCG/ACT inhaler   Other Relevant Orders   CBC with Differential/Platelet   CMP14+EGFR   Lipid panel     Genitourinary   BPH (benign prostatic hyperplasia)   Relevant Orders   CBC with Differential/Platelet   CMP14+EGFR   Lipid panel     Other   Smoking addiction   Relevant Orders   CBC with Differential/Platelet   CMP14+EGFR   Lipid panel    Will check blood work today, patient has occasional exertional dyspnea along with his chronic cough.  Will given him albuterol inhaler. Follow up plan: Return in about 6 months (around 04/30/2024), or if symptoms worsen or fail to improve, for COPD and BPH.  Counseling provided for all of the vaccine components Orders Placed This Encounter  Procedures   CBC with Differential/Platelet   CMP14+EGFR   Lipid panel    Arville Care, MD [facility] 11/01/2023, 3:46 PM

## 2023-11-02 LAB — CBC WITH DIFFERENTIAL/PLATELET
Basophils Absolute: 0.1 10*3/uL (ref 0.0–0.2)
Basos: 1 %
EOS (ABSOLUTE): 0.2 10*3/uL (ref 0.0–0.4)
Eos: 2 %
Hematocrit: 39.9 % (ref 37.5–51.0)
Hemoglobin: 14 g/dL (ref 13.0–17.7)
Immature Grans (Abs): 0 10*3/uL (ref 0.0–0.1)
Immature Granulocytes: 0 %
Lymphocytes Absolute: 3 10*3/uL (ref 0.7–3.1)
Lymphs: 38 %
MCH: 33.3 pg — ABNORMAL HIGH (ref 26.6–33.0)
MCHC: 35.1 g/dL (ref 31.5–35.7)
MCV: 95 fL (ref 79–97)
Monocytes Absolute: 0.5 10*3/uL (ref 0.1–0.9)
Monocytes: 7 %
Neutrophils Absolute: 4 10*3/uL (ref 1.4–7.0)
Neutrophils: 52 %
Platelets: 232 10*3/uL (ref 150–450)
RBC: 4.21 x10E6/uL (ref 4.14–5.80)
RDW: 12.5 % (ref 11.6–15.4)
WBC: 7.7 10*3/uL (ref 3.4–10.8)

## 2023-11-02 LAB — CMP14+EGFR
ALT: 15 IU/L (ref 0–44)
AST: 17 IU/L (ref 0–40)
Albumin: 4.1 g/dL (ref 3.8–4.8)
Alkaline Phosphatase: 94 IU/L (ref 44–121)
BUN/Creatinine Ratio: 12 (ref 10–24)
BUN: 10 mg/dL (ref 8–27)
Bilirubin Total: 0.3 mg/dL (ref 0.0–1.2)
CO2: 24 mmol/L (ref 20–29)
Calcium: 9.1 mg/dL (ref 8.6–10.2)
Chloride: 101 mmol/L (ref 96–106)
Creatinine, Ser: 0.81 mg/dL (ref 0.76–1.27)
Globulin, Total: 2.5 g/dL (ref 1.5–4.5)
Glucose: 82 mg/dL (ref 70–99)
Potassium: 4.5 mmol/L (ref 3.5–5.2)
Sodium: 140 mmol/L (ref 134–144)
Total Protein: 6.6 g/dL (ref 6.0–8.5)
eGFR: 91 mL/min/{1.73_m2} (ref >59)

## 2023-11-02 LAB — LIPID PANEL
Chol/HDL Ratio: 2.6 ratio (ref 0.0–5.0)
Cholesterol, Total: 171 mg/dL (ref 100–199)
HDL: 67 mg/dL (ref >39)
LDL Chol Calc (NIH): 93 mg/dL (ref 0–99)
Triglycerides: 58 mg/dL (ref 0–149)
VLDL Cholesterol Cal: 11 mg/dL (ref 5–40)

## 2023-11-08 ENCOUNTER — Encounter: Payer: Self-pay | Admitting: Family Medicine

## 2023-12-03 ENCOUNTER — Ambulatory Visit: Payer: PPO | Admitting: Urology

## 2023-12-03 ENCOUNTER — Encounter: Payer: Self-pay | Admitting: Urology

## 2023-12-03 VITALS — BP 126/71 | HR 85

## 2023-12-03 DIAGNOSIS — R351 Nocturia: Secondary | ICD-10-CM | POA: Diagnosis not present

## 2023-12-03 DIAGNOSIS — N401 Enlarged prostate with lower urinary tract symptoms: Secondary | ICD-10-CM

## 2023-12-03 DIAGNOSIS — N3943 Post-void dribbling: Secondary | ICD-10-CM | POA: Diagnosis not present

## 2023-12-03 LAB — URINALYSIS, ROUTINE W REFLEX MICROSCOPIC
Bilirubin, UA: NEGATIVE
Glucose, UA: NEGATIVE
Leukocytes,UA: NEGATIVE
Nitrite, UA: NEGATIVE
Protein,UA: NEGATIVE
RBC, UA: NEGATIVE
Specific Gravity, UA: 1.025 (ref 1.005–1.030)
Urobilinogen, Ur: 4 mg/dL — ABNORMAL HIGH (ref 0.2–1.0)
pH, UA: 6 (ref 5.0–7.5)

## 2023-12-03 NOTE — Patient Instructions (Signed)

## 2023-12-03 NOTE — Progress Notes (Signed)
 12/03/2023 12:32 PM   Spencer Miller March 12, 1946 [id number]  Referring provider: Dettinger, Elige Radon, MD [address] [place],  [place] [address]  Nocturia   HPI: [name] is a 78yo here for followup for BPH with nocturia. He notes his urinary frequency is every 3 hours. He drinks black tea 10-15 times per day. He does not drink water. His nocturia is improved to 1x per night. He drinks tea within 1 hour of going to bed. He has to double void in the morning with the second void is a small volume.    PMH: Past Medical History:  Diagnosis Date   Aortic aneurysm (HCC)    measuring 4.2   Cataract    Bilateral    COPD (chronic obstructive pulmonary disease) (HCC)    COPD (chronic obstructive pulmonary disease) (HCC)    Emphysema lung (HCC)    H. pylori infection    bismuth quadruple therapy with tetracycline, metronidazole, pantoprazole twice daily   History of brain surgery    SCCA (squamous cell carcinoma) of skin 05/23/2013   Right Cheek(well diff) (curet and 5FU)   SCCA (squamous cell carcinoma) of skin 11/26/2015   Left Cheek(well diff) (curet and 5FU)    Surgical History: Past Surgical History:  Procedure Laterality Date   BIOPSY  08/18/2023   Procedure: BIOPSY;  Surgeon: Corbin Ade, MD;  [facility];  Service: Endoscopy;;   CATARACT EXTRACTION, BILATERAL     ESOPHAGOGASTRODUODENOSCOPY (EGD) WITH PROPOFOL N/A 08/18/2023   Procedure: ESOPHAGOGASTRODUODENOSCOPY (EGD) WITH PROPOFOL;  Surgeon: Corbin Ade, MD;  [facility];  Service: Endoscopy;  Laterality: N/A;  915am, asa 3   FRACTURE SURGERY     HERNIA REPAIR      Home Medications:  Allergies as of 12/03/2023   No Known Allergies      Medication List        Accurate as of December 03, 2023 12:32 PM. If you have any questions, ask your nurse or doctor.          albuterol 108 (90 Base) MCG/ACT inhaler Commonly known as: VENTOLIN HFA Inhale 2 puffs into the lungs every 6  (six) hours as needed for wheezing or shortness of breath.   alfuzosin 10 MG 24 hr tablet Commonly known as: UROXATRAL Take 1 tablet (10 mg total) by mouth at bedtime.        Allergies: No Known Allergies  Family History: Family History  Problem Relation Age of Onset   Obesity Mother    Melanoma Father    Colon cancer Sister        early 102s   Obesity Sister    Obesity Sister    Obesity Sister    Cancer Brother        stomach   Stomach cancer Brother    Healthy Son    Healthy Son    Healthy Son    Healthy Son     Social History:  reports that he has been smoking cigarettes. He started smoking about 57 years ago. He has a 57.2 pack-year smoking history. He has never used smokeless tobacco. He reports current alcohol use of about 14.0 standard drinks of alcohol per week. He reports that he does not use drugs.  ROS: All other review of systems were reviewed and are negative except what is noted above in HPI  Physical Exam: BP 126/71   Pulse 85   Constitutional:  Alert and oriented, No acute distress.  HEENT: [place] AT, moist mucus membranes.  Trachea midline, no masses. Cardiovascular: No clubbing, cyanosis, or edema. Respiratory: Normal respiratory effort, no increased work of breathing. GI: Abdomen is soft, nontender, nondistended, no abdominal masses GU: No CVA tenderness.  Lymph: No cervical or inguinal lymphadenopathy. Skin: No rashes, bruises or suspicious lesions. Neurologic: Grossly intact, no focal deficits, moving all 4 extremities. Psychiatric: Normal mood and affect.  Laboratory Data: Lab Results  Component Value Date   WBC 7.7 11/01/2023   HGB 14.0 11/01/2023   HCT 39.9 11/01/2023   MCV 95 11/01/2023   PLT 232 11/01/2023    Lab Results  Component Value Date   CREATININE 0.81 11/01/2023    No results found for: "PSA"  No results found for: "TESTOSTERONE"  No results found for: "HGBA1C"  Urinalysis    Component Value Date/Time    APPEARANCEUR Clear 07/30/2023 1143   GLUCOSEU Negative 07/30/2023 1143   BILIRUBINUR Negative 07/30/2023 1143   PROTEINUR Negative 07/30/2023 1143   NITRITE Negative 07/30/2023 1143   LEUKOCYTESUR Negative 07/30/2023 1143    Lab Results  Component Value Date   LABMICR Comment 07/30/2023    Pertinent Imaging:  No results found for this or any previous visit.  No results found for this or any previous visit.  No results found for this or any previous visit.  No results found for this or any previous visit.  No results found for this or any previous visit.  No results found for this or any previous visit.  No results found for this or any previous visit.  No results found for this or any previous visit.   Assessment & Plan:    1. Benign prostatic hyperplasia with post-void dribbling (Primary) Contiue uroxatral 10mg  daily - Urinalysis, Routine w reflex microscopic  2. Nocturia -continue uroxatral 10mg  -patient instructed to decrease black tea intake   No follow-ups on file.  Wilkie Aye, MD  [facility] [place]

## 2024-01-05 ENCOUNTER — Ambulatory Visit: Admitting: Dermatology

## 2024-03-08 DIAGNOSIS — D485 Neoplasm of uncertain behavior of skin: Secondary | ICD-10-CM | POA: Diagnosis not present

## 2024-03-08 DIAGNOSIS — Z85828 Personal history of other malignant neoplasm of skin: Secondary | ICD-10-CM | POA: Diagnosis not present

## 2024-03-08 DIAGNOSIS — L814 Other melanin hyperpigmentation: Secondary | ICD-10-CM | POA: Diagnosis not present

## 2024-03-08 DIAGNOSIS — L570 Actinic keratosis: Secondary | ICD-10-CM | POA: Diagnosis not present

## 2024-03-08 DIAGNOSIS — L821 Other seborrheic keratosis: Secondary | ICD-10-CM | POA: Diagnosis not present

## 2024-03-28 ENCOUNTER — Ambulatory Visit: Admitting: Dermatology

## 2024-03-29 ENCOUNTER — Encounter: Payer: Self-pay | Admitting: Acute Care

## 2024-05-01 ENCOUNTER — Encounter: Payer: Self-pay | Admitting: Family Medicine

## 2024-05-01 ENCOUNTER — Ambulatory Visit (INDEPENDENT_AMBULATORY_CARE_PROVIDER_SITE_OTHER): Payer: PPO | Admitting: Family Medicine

## 2024-05-01 VITALS — BP 118/70 | HR 88 | Ht 71.0 in | Wt 155.0 lb

## 2024-05-01 DIAGNOSIS — F17200 Nicotine dependence, unspecified, uncomplicated: Secondary | ICD-10-CM | POA: Diagnosis not present

## 2024-05-01 DIAGNOSIS — J432 Centrilobular emphysema: Secondary | ICD-10-CM | POA: Diagnosis not present

## 2024-05-01 DIAGNOSIS — R600 Localized edema: Secondary | ICD-10-CM

## 2024-05-01 DIAGNOSIS — N3943 Post-void dribbling: Secondary | ICD-10-CM | POA: Diagnosis not present

## 2024-05-01 DIAGNOSIS — N401 Enlarged prostate with lower urinary tract symptoms: Secondary | ICD-10-CM | POA: Diagnosis not present

## 2024-05-01 DIAGNOSIS — I700 Atherosclerosis of aorta: Secondary | ICD-10-CM

## 2024-05-01 LAB — LIPID PANEL

## 2024-05-01 MED ORDER — ALBUTEROL SULFATE HFA 108 (90 BASE) MCG/ACT IN AERS: 2.0000 | INHALATION_SPRAY | Freq: Four times a day (QID) | RESPIRATORY_TRACT | 5 refills | Status: DC | PRN

## 2024-05-01 NOTE — Progress Notes (Signed)
 BP 118/70   Pulse 88   Ht 5' 11 (1.803 m)   Wt 155 lb (70.3 kg)   SpO2 95%   BMI 21.62 kg/m    Subjective:   Patient ID: Spencer Miller., male    DOB: [birth year], 78 y.o.   MRN: [id number]  HPI: Spencer Miller. is a 78 y.o. male presenting on 05/01/2024 for Medical Management of Chronic Issues and COPD   Discussed the use of AI scribe software for clinical note transcription with the patient, who gave verbal consent to proceed.  History of Present Illness   Spencer Miller. is a 78 year old male with COPD who presents for a recheck of his breathing.  He describes his breathing as 'okay' but acknowledges the need for more physical activity. He finds it difficult to exercise outdoors in high temperatures and humidity, which he feels are unsuitable for his age. He is trying to stay hydrated but finds it challenging due to his high tea consumption. He uses albuterol  intermittently but has lost his current inhaler. His wife reminds him to use it when necessary.  He is taking medication for his prostate and reports urinating once a night. He notes that caffeine consumption increases his nighttime urination. His son has advised him to reduce caffeine intake, especially in the evening.  He experiences swelling in his legs, particularly the left leg, which he manages by elevating his feet at night. The swelling was more pronounced last summer and has been less severe since becoming more sedentary. He has used compression stockings in the past but finds them less effective once swelling has occurred. He mentions that crossing his legs is a habit, which his wife comments on.      Relevant past medical, surgical, family and social history reviewed and updated as indicated. Interim medical history since our last visit reviewed. Allergies and medications reviewed and updated.  Review of Systems  Constitutional:  Negative for chills and fever.  Eyes:  Negative for visual disturbance.   Respiratory:  Negative for shortness of breath and wheezing.   Cardiovascular:  Positive for leg swelling. Negative for chest pain.  Musculoskeletal:  Negative for back pain and gait problem.  Skin:  Negative for rash.  All other systems reviewed and are negative.   Per HPI unless specifically indicated above   Allergies as of 05/01/2024   No Known Allergies      Medication List        Accurate as of May 01, 2024  3:45 PM. If you have any questions, ask your nurse or doctor.          albuterol  108 (90 Base) MCG/ACT inhaler Commonly known as: VENTOLIN  HFA Inhale 2 puffs into the lungs every 6 (six) hours as needed for wheezing or shortness of breath.   alfuzosin  10 MG 24 hr tablet Commonly known as: UROXATRAL  Take 1 tablet (10 mg total) by mouth at bedtime.         Objective:   BP 118/70   Pulse 88   Ht 5' 11 (1.803 m)   Wt 155 lb (70.3 kg)   SpO2 95%   BMI 21.62 kg/m   Wt Readings from Last 3 Encounters:  05/01/24 155 lb (70.3 kg)  11/01/23 166 lb (75.3 kg)  10/14/23 162 lb 12.8 oz (73.8 kg)    Physical Exam Physical Exam   NECK: Thyroid  normal. CHEST: Lungs clear to auscultation, no wheezing. CARDIOVASCULAR: Heart regular rate and  rhythm, no murmurs. EXTREMITIES: 1+ pitting edema in lower extremities, left greater than right.         Assessment & Plan:   Problem List Items Addressed This Visit       Cardiovascular and Mediastinum   Aortic atherosclerosis (HCC)   Relevant Orders   Lipid panel     Respiratory   Emphysema of lung (HCC) - Primary   Relevant Medications   albuterol  (VENTOLIN  HFA) 108 (90 Base) MCG/ACT inhaler   Other Relevant Orders   CBC with Differential/Platelet   CMP14+EGFR     Genitourinary   BPH (benign prostatic hyperplasia)   Relevant Orders   PSA, total and free     Other   Peripheral edema   Smoking addiction       Chronic obstructive pulmonary disease (COPD) COPD with symptoms requiring occasional  use of albuterol  inhaler, currently unavailable due to loss. - Send refill for albuterol  inhaler. - Encourage increased physical activity as tolerated, especially with cooler weather.  Lower extremity edema Chronic lower extremity edema, more pronounced in the left leg. Swelling is 1+. - Encourage leg elevation when sitting. - Recommend use of compression stockings or ACE wraps during the day. - Discuss potential use of Velcro compression wraps for easier application. - Encourage increased physical activity to improve circulation.  Benign prostatic hyperplasia with lower urinary tract symptoms Benign prostatic hyperplasia with nocturia, urinating once per night. Discussed impact of caffeine on urinary frequency. - Continue current prostate medication. - Advise reducing caffeine intake, particularly in the evening.  Follow-Up Follow-up plans discussed. - Order blood work.          Follow up plan: Return in about 6 months (around 11/01/2024), or if symptoms worsen or fail to improve, for COPD and hypertension.  Counseling provided for all of the vaccine components Orders Placed This Encounter  Procedures   CBC with Differential/Platelet   CMP14+EGFR   Lipid panel   PSA, total and free    Fonda Levins, MD [facility] 05/01/2024, 3:45 PM

## 2024-05-02 LAB — CMP14+EGFR
ALT: 13 IU/L (ref 0–44)
AST: 14 IU/L (ref 0–40)
Albumin: 4.1 g/dL (ref 3.8–4.8)
Alkaline Phosphatase: 86 IU/L (ref 44–121)
BUN/Creatinine Ratio: 12 (ref 10–24)
BUN: 12 mg/dL (ref 8–27)
Bilirubin Total: 0.6 mg/dL (ref 0.0–1.2)
CO2: 22 mmol/L (ref 20–29)
Calcium: 9.4 mg/dL (ref 8.6–10.2)
Chloride: 103 mmol/L (ref 96–106)
Creatinine, Ser: 1.04 mg/dL (ref 0.76–1.27)
Globulin, Total: 2.6 g/dL (ref 1.5–4.5)
Glucose: 89 mg/dL (ref 70–99)
Potassium: 4.6 mmol/L (ref 3.5–5.2)
Sodium: 137 mmol/L (ref 134–144)
Total Protein: 6.7 g/dL (ref 6.0–8.5)
eGFR: 74 mL/min/1.73 (ref >59)

## 2024-05-02 LAB — PSA, TOTAL AND FREE
PSA, Free Pct: 38.6
PSA, Free: 0.54 ng/mL
Prostate Specific Ag, Serum: 1.4 ng/mL (ref 0.0–4.0)

## 2024-05-02 LAB — CBC WITH DIFFERENTIAL/PLATELET
Basophils Absolute: 0 x10E3/uL (ref 0.0–0.2)
Basos: 1 %
EOS (ABSOLUTE): 0.1 x10E3/uL (ref 0.0–0.4)
Eos: 2 %
Hematocrit: 38.7 % (ref 37.5–51.0)
Hemoglobin: 13.5 g/dL (ref 13.0–17.7)
Immature Grans (Abs): 0 x10E3/uL (ref 0.0–0.1)
Immature Granulocytes: 0 %
Lymphocytes Absolute: 2.7 x10E3/uL (ref 0.7–3.1)
Lymphs: 36 %
MCH: 33.3 pg — ABNORMAL HIGH (ref 26.6–33.0)
MCHC: 34.9 g/dL (ref 31.5–35.7)
MCV: 96 fL (ref 79–97)
Monocytes Absolute: 0.4 x10E3/uL (ref 0.1–0.9)
Monocytes: 6 %
Neutrophils Absolute: 4.2 x10E3/uL (ref 1.4–7.0)
Neutrophils: 55 %
Platelets: 211 x10E3/uL (ref 150–450)
RBC: 4.05 x10E6/uL — ABNORMAL LOW (ref 4.14–5.80)
RDW: 12.7 % (ref 11.6–15.4)
WBC: 7.5 x10E3/uL (ref 3.4–10.8)

## 2024-05-02 LAB — LIPID PANEL
Cholesterol, Total: 166 mg/dL (ref 100–199)
HDL: 57 mg/dL (ref >39)
LDL CALC COMMENT:: 2.9 ratio (ref 0.0–5.0)
LDL Chol Calc (NIH): 95 mg/dL (ref 0–99)
Triglycerides: 73 mg/dL (ref 0–149)
VLDL Cholesterol Cal: 14 mg/dL (ref 5–40)

## 2024-05-03 ENCOUNTER — Encounter: Payer: Self-pay | Admitting: Family Medicine

## 2024-05-05 ENCOUNTER — Ambulatory Visit: Payer: Self-pay | Admitting: Family Medicine

## 2024-05-25 ENCOUNTER — Encounter: Payer: Self-pay | Admitting: Acute Care

## 2024-06-02 ENCOUNTER — Encounter: Payer: Self-pay | Admitting: Urology

## 2024-06-02 ENCOUNTER — Ambulatory Visit: Admitting: Urology

## 2024-06-02 VITALS — BP 111/67 | HR 102

## 2024-06-02 DIAGNOSIS — N3943 Post-void dribbling: Secondary | ICD-10-CM | POA: Diagnosis not present

## 2024-06-02 DIAGNOSIS — R351 Nocturia: Secondary | ICD-10-CM

## 2024-06-02 DIAGNOSIS — N401 Enlarged prostate with lower urinary tract symptoms: Secondary | ICD-10-CM | POA: Diagnosis not present

## 2024-06-02 LAB — URINALYSIS, ROUTINE W REFLEX MICROSCOPIC
Bilirubin, UA: NEGATIVE
Glucose, UA: NEGATIVE
Leukocytes,UA: NEGATIVE
Nitrite, UA: NEGATIVE
RBC, UA: NEGATIVE
Specific Gravity, UA: 1.015 (ref 1.005–1.030)
Urobilinogen, Ur: 2 mg/dL — ABNORMAL HIGH (ref 0.2–1.0)
pH, UA: 6.5 (ref 5.0–7.5)

## 2024-06-02 MED ORDER — ALFUZOSIN HCL ER 10 MG PO TB24: 10.0000 mg | ORAL_TABLET | Freq: Every day | ORAL | 11 refills | Status: AC

## 2024-06-02 NOTE — Progress Notes (Signed)
 06/02/2024 11:35 AM   Spencer Miller 04/18/1946 [id number]  Referring provider: Dettinger, Fonda DELENA, MD [address] [place],  [place] [address]  Followup BPH and nocturia   HPI: Spencer Miller is 78yo here for followup for BPH and nocturia. He has decreased his black tea consumption to 7-8x per day. Nocturia 1x. IPSS 4 QOL 2 on uroxatral  10mg  at bedtime. He has urinary frequency every 2-3 hours. Urine stream is fair. He is unhappy with his urinary stream. His urination at night is a large volume. He drinks coffee/tea up until 10pm.    PMH: Past Medical History:  Diagnosis Date   Aortic aneurysm    measuring 4.2   Cataract    Bilateral    COPD (chronic obstructive pulmonary disease) (HCC)    COPD (chronic obstructive pulmonary disease) (HCC)    Emphysema lung (HCC)    H. pylori infection    bismuth  quadruple therapy with tetracycline , metronidazole , pantoprazole  twice daily   History of brain surgery    SCCA (squamous cell carcinoma) of skin 05/23/2013   Right Cheek(well diff) (curet and 5FU)   SCCA (squamous cell carcinoma) of skin 11/26/2015   Left Cheek(well diff) (curet and 5FU)    Surgical History: Past Surgical History:  Procedure Laterality Date   BIOPSY  08/18/2023   Procedure: BIOPSY;  Surgeon: Shaaron Lamar HERO, MD;  [facility];  Service: Endoscopy;;   CATARACT EXTRACTION, BILATERAL     ESOPHAGOGASTRODUODENOSCOPY (EGD) WITH PROPOFOL  N/A 08/18/2023   Procedure: ESOPHAGOGASTRODUODENOSCOPY (EGD) WITH PROPOFOL ;  Surgeon: Shaaron Lamar HERO, MD;  [facility];  Service: Endoscopy;  Laterality: N/A;  915am, asa 3   FRACTURE SURGERY     HERNIA REPAIR      Home Medications:  Allergies as of 06/02/2024   No Known Allergies      Medication List        Accurate as of June 02, 2024 11:35 AM. If you have any questions, ask your nurse or doctor.          albuterol  108 (90 Base) MCG/ACT inhaler Commonly known as: VENTOLIN  HFA Inhale 2  puffs into the lungs every 6 (six) hours as needed for wheezing or shortness of breath.   alfuzosin  10 MG 24 hr tablet Commonly known as: UROXATRAL  Take 1 tablet (10 mg total) by mouth at bedtime.        Allergies: No Known Allergies  Family History: Family History  Problem Relation Age of Onset   Obesity Mother    Melanoma Father    Colon cancer Sister        early 2s   Obesity Sister    Obesity Sister    Obesity Sister    Cancer Brother        stomach   Stomach cancer Brother    Healthy Son    Healthy Son    Healthy Son    Healthy Son     Social History:  reports that he has been smoking cigarettes. He started smoking about 57 years ago. He has a 57.7 pack-year smoking history. He has never used smokeless tobacco. He reports current alcohol use of about 14.0 standard drinks of alcohol per week. He reports that he does not use drugs.  ROS: All other review of systems were reviewed and are negative except what is noted above in HPI  Physical Exam: BP 111/67   Pulse (!) 102   Constitutional:  Alert and oriented, No acute distress.  HEENT: [place] AT, moist mucus membranes.  Trachea midline, no masses. Cardiovascular: No clubbing, cyanosis, or edema. Respiratory: Normal respiratory effort, no increased work of breathing. GI: Abdomen is soft, nontender, nondistended, no abdominal masses GU: No CVA tenderness.  Lymph: No cervical or inguinal lymphadenopathy. Skin: No rashes, bruises or suspicious lesions. Neurologic: Grossly intact, no focal deficits, moving all 4 extremities. Psychiatric: Normal mood and affect.  Laboratory Data: Lab Results  Component Value Date   WBC 7.5 05/01/2024   HGB 13.5 05/01/2024   HCT 38.7 05/01/2024   MCV 96 05/01/2024   PLT 211 05/01/2024    Lab Results  Component Value Date   CREATININE 1.04 05/01/2024    No results found for: PSA  No results found for: TESTOSTERONE  No results found for: HGBA1C  Urinalysis     Component Value Date/Time   APPEARANCEUR Clear 12/03/2023 1228   GLUCOSEU Negative 12/03/2023 1228   BILIRUBINUR Negative 12/03/2023 1228   PROTEINUR Negative 12/03/2023 1228   NITRITE Negative 12/03/2023 1228   LEUKOCYTESUR Negative 12/03/2023 1228    Lab Results  Component Value Date   LABMICR Comment 12/03/2023    Pertinent Imaging:  No results found for this or any previous visit.  No results found for this or any previous visit.  No results found for this or any previous visit.  No results found for this or any previous visit.  No results found for this or any previous visit.  No results found for this or any previous visit.  No results found for this or any previous visit.  No results found for this or any previous visit.   Assessment & Plan:    1. Benign prostatic hyperplasia with post-void dribbling (Primary) Continue uroxatral  10mg  qhs - Urinalysis, Routine w reflex microscopic  2. Nocturia Continue uroxatral  10mg     No follow-ups on file.  Belvie Clara, MD  [facility] [place]

## 2024-06-02 NOTE — Patient Instructions (Signed)

## 2024-08-01 ENCOUNTER — Ambulatory Visit (HOSPITAL_COMMUNITY)
Admission: RE | Admit: 2024-08-01 | Discharge: 2024-08-01 | Disposition: A | Discharge status: Home / Self Care | Source: Ambulatory Visit | Attending: Acute Care | Admitting: Acute Care

## 2024-08-01 DIAGNOSIS — F17210 Nicotine dependence, cigarettes, uncomplicated: Secondary | ICD-10-CM | POA: Diagnosis not present

## 2024-08-01 DIAGNOSIS — Z87891 Personal history of nicotine dependence: Secondary | ICD-10-CM | POA: Diagnosis not present

## 2024-08-01 DIAGNOSIS — Z122 Encounter for screening for malignant neoplasm of respiratory organs: Secondary | ICD-10-CM | POA: Insufficient documentation

## 2024-08-09 ENCOUNTER — Encounter: Payer: Self-pay | Admitting: Family Medicine

## 2024-08-15 ENCOUNTER — Other Ambulatory Visit: Payer: Self-pay | Admitting: Surgery

## 2024-08-15 DIAGNOSIS — I7121 Aneurysm of the ascending aorta, without rupture: Secondary | ICD-10-CM

## 2024-08-31 ENCOUNTER — Encounter: Payer: Self-pay | Admitting: Family Medicine

## 2024-09-01 ENCOUNTER — Ambulatory Visit (INDEPENDENT_AMBULATORY_CARE_PROVIDER_SITE_OTHER): Admitting: Family

## 2024-09-01 ENCOUNTER — Encounter: Payer: Self-pay | Admitting: Family

## 2024-09-01 VITALS — BP 126/68 | HR 117 | Temp 101.0°F | Ht 71.0 in | Wt 164.4 lb

## 2024-09-01 DIAGNOSIS — J439 Emphysema, unspecified: Secondary | ICD-10-CM | POA: Diagnosis not present

## 2024-09-01 DIAGNOSIS — R509 Fever, unspecified: Secondary | ICD-10-CM | POA: Diagnosis not present

## 2024-09-01 DIAGNOSIS — J101 Influenza due to other identified influenza virus with other respiratory manifestations: Secondary | ICD-10-CM | POA: Diagnosis not present

## 2024-09-01 LAB — VERITOR SARS-COV-2 AND FLU A+B
BD Veritor SARS-CoV-2 Ag: NEGATIVE
Influenza A: POSITIVE — AB
Influenza B: NEGATIVE

## 2024-09-01 MED ORDER — PREDNISONE 10 MG (21) PO TBPK: ORAL_TABLET | ORAL | 0 refills | Status: AC

## 2024-09-01 MED ORDER — OSELTAMIVIR PHOSPHATE 75 MG PO CAPS: 75.0000 mg | ORAL_CAPSULE | Freq: Two times a day (BID) | ORAL | 0 refills | Status: AC

## 2024-09-01 MED ORDER — ALBUTEROL SULFATE HFA 108 (90 BASE) MCG/ACT IN AERS: 2.0000 | INHALATION_SPRAY | Freq: Four times a day (QID) | RESPIRATORY_TRACT | 5 refills | Status: AC | PRN

## 2024-09-01 MED ORDER — ALBUTEROL SULFATE (2.5 MG/3ML) 0.083% IN NEBU: 2.5000 mg | INHALATION_SOLUTION | Freq: Four times a day (QID) | RESPIRATORY_TRACT | 1 refills | Status: AC | PRN

## 2024-09-01 NOTE — Patient Instructions (Signed)

## 2024-09-01 NOTE — Progress Notes (Signed)
 "  Subjective:    Patient ID: Spencer DELENA Clide Mickey., male    DOB: [birth year], 78 y.o.   MRN: [id number]  Chief Complaint  Patient presents with   Cough   Pt presents to the office today with cough started two days ago that is worsening.  Cough This is a new problem. The current episode started in the past 7 days. The problem has been gradually worsening. The problem occurs every few minutes. The cough is Productive of sputum. Associated symptoms include a fever, nasal congestion, postnasal drip, shortness of breath and wheezing. Pertinent negatives include no chills, ear congestion, ear pain, headaches or myalgias. Risk factors for lung disease include smoking/tobacco exposure. He has tried rest for the symptoms. The treatment provided mild relief. His past medical history is significant for emphysema.      Review of Systems  Constitutional:  Positive for fever. Negative for chills.  HENT:  Positive for postnasal drip. Negative for ear pain.   Respiratory:  Positive for cough, shortness of breath and wheezing.   Musculoskeletal:  Negative for myalgias.  Neurological:  Negative for headaches.  All other systems reviewed and are negative.   Social History   Socioeconomic History   Marital status: Married    Spouse name: Sharlet   Number of children: 4   Years of education: Not on file   Highest education level: Some college, no degree  Occupational History    Comment: Retired [employer] 25 years    Comment: Retired [employer] 15 years  Tobacco Use   Smoking status: Every Day    Current packs/day: 1.00    Average packs/day: 1 pack/day for 58.0 years (58.0 ttl pk-yrs)    Types: Cigarettes    Start date: 1968   Smokeless tobacco: Never  Vaping Use   Vaping status: Never Used  Substance and Sexual Activity   Alcohol use: Yes    Alcohol/week: 14.0 standard drinks of alcohol    Types: 14 Cans of beer per week   Drug use: No   Sexual activity: Not Currently  Other  Topics Concern   Not on file  Social History Narrative   Married x 43 in 2022.   1 grandchild   Social Drivers of Health   Tobacco Use: High Risk (06/02/2024)   Patient History    Smoking Tobacco Use: Every Day    Smokeless Tobacco Use: Never    Passive Exposure: Not on file  Financial Resource Strain: Low Risk (04/29/2024)   Overall Financial Resource Strain (CARDIA)    Difficulty of Paying Living Expenses: Not hard at all  Food Insecurity: No Food Insecurity (04/29/2024)   Epic    Worried About Radiation Protection Practitioner of Food in the Last Year: Never true    Ran Out of Food in the Last Year: Never true  Transportation Needs: No Transportation Needs (04/29/2024)   Epic    Lack of Transportation (Medical): No    Lack of Transportation (Non-Medical): No  Physical Activity: Insufficiently Active (04/29/2024)   Exercise Vital Sign    Days of Exercise per Week: 2 days    Minutes of Exercise per Session: 30 min  Stress: No Stress Concern Present (04/29/2024)   [employer] of Occupational Health - Occupational Stress Questionnaire    Feeling of Stress: Not at all  Social Connections: Moderately Isolated (04/29/2024)   Social Connection and Isolation Panel    Frequency of Communication with Friends and Family: Three times a week    Frequency  of Social Gatherings with Friends and Family: Once a week    Attends Religious Services: Never    Database Administrator or Organizations: No    Attends Engineer, Structural: Not on file    Marital Status: Married  Depression (PHQ2-9): Low Risk (09/01/2024)   Depression (PHQ2-9)    PHQ-2 Score: 0  Alcohol Screen: Low Risk (04/29/2024)   Alcohol Screen    Last Alcohol Screening Score (AUDIT): 2  Housing: Low Risk (04/29/2024)   Epic    Unable to Pay for Housing in the Last Year: No    Number of Times Moved in the Last Year: 0    Homeless in the Last Year: No  Utilities: Not on file  Health Literacy: Not on file   Family History  Problem  Relation Age of Onset   Obesity Mother    Melanoma Father    Colon cancer Sister        early 79s   Obesity Sister    Obesity Sister    Obesity Sister    Cancer Brother        stomach   Stomach cancer Brother    Healthy Son    Healthy Son    Healthy Son    Healthy Son         Objective:   Physical Exam Vitals reviewed.  Constitutional:      General: He is not in acute distress.    Appearance: He is well-developed.  HENT:     Head: Normocephalic.     Right Ear: Tympanic membrane normal.     Left Ear: Tympanic membrane normal.  Eyes:     General:        Right eye: No discharge.        Left eye: No discharge.     Pupils: Pupils are equal, round, and reactive to light.  Neck:     Thyroid : No thyromegaly.  Cardiovascular:     Rate and Rhythm: Normal rate and regular rhythm.     Heart sounds: Normal heart sounds. No murmur heard. Pulmonary:     Effort: Pulmonary effort is normal. No respiratory distress.     Breath sounds: Normal breath sounds. No wheezing.  Abdominal:     General: Bowel sounds are normal. There is no distension.     Palpations: Abdomen is soft.     Tenderness: There is no abdominal tenderness.  Musculoskeletal:        General: No tenderness. Normal range of motion.     Cervical back: Normal range of motion and neck supple.  Skin:    General: Skin is warm and dry.     Findings: No erythema or rash.  Neurological:     Mental Status: He is alert and oriented to person, place, and time.     Cranial Nerves: No cranial nerve deficit.     Deep Tendon Reflexes: Reflexes are normal and symmetric.  Psychiatric:        Behavior: Behavior normal.        Thought Content: Thought content normal.        Judgment: Judgment normal.       BP 126/68   Pulse (!) 117   Temp (!) 101 F (38.3 C) (Temporal)   Ht 5' 11 (1.803 m)   Wt 164 lb 6.4 oz (74.6 kg)   SpO2 95%   BMI 22.93 kg/m      Assessment & Plan:  Spencer DELENA Clide Mickey. comes in  today with  chief complaint of Cough   Diagnosis and orders addressed:  1. Fever in adult (Primary) - Veritor SARS-CoV-2 and Flu A+B  2. Influenza A Rest Force fluids Start tamiflu   Albuterol  prn  Strict precautions to go to ED Start prednisone   Tylenol given in office, encouraged to take 8 hours  - oseltamivir  (TAMIFLU ) 75 MG capsule; Take 1 capsule (75 mg total) by mouth 2 (two) times daily.  Dispense: 10 capsule; Refill: 0 - albuterol  (VENTOLIN  HFA) 108 (90 Base) MCG/ACT inhaler; Inhale 2 puffs into the lungs every 6 (six) hours as needed for wheezing or shortness of breath.  Dispense: 8 g; Refill: 5 - albuterol  (PROVENTIL ) (2.5 MG/3ML) 0.083% nebulizer solution; Take 3 mLs (2.5 mg total) by nebulization every 6 (six) hours as needed for wheezing or shortness of breath.  Dispense: 150 mL; Refill: 1 - predniSONE  (STERAPRED UNI-PAK 21 TAB) 10 MG (21) TBPK tablet; Use as directed  Dispense: 21 tablet; Refill: 0  3. Emphysema of lung (HCC)      Bari Learn, FNP   "

## 2024-09-04 ENCOUNTER — Other Ambulatory Visit (HOSPITAL_COMMUNITY): Payer: Self-pay

## 2024-09-04 ENCOUNTER — Ambulatory Visit: Payer: Self-pay | Admitting: Family

## 2024-09-04 ENCOUNTER — Telehealth: Payer: Self-pay | Admitting: Pharmacy Technician

## 2024-09-04 NOTE — Telephone Encounter (Signed)
 Pharmacy Patient Advocate Encounter   Received notification from Onbase that prior authorization for Albuterol  Sulfate (2.5 MG/3ML)0.083% nebulizer solution is required/requested.   Insurance verification completed.   The patient is insured through [facility] ADVANTAGE/RX ADVANCE.   Per test claim: The current 13 day co-pay is, $1.47.  No PA needed at this time. This test claim was processed through [facility]- copay amounts may vary at other pharmacies due to pharmacy/plan contracts, or as the patient moves through the different stages of their insurance plan.

## 2024-09-05 ENCOUNTER — Other Ambulatory Visit: Payer: Self-pay | Admitting: Surgery

## 2024-09-05 DIAGNOSIS — I7121 Aneurysm of the ascending aorta, without rupture: Secondary | ICD-10-CM

## 2024-09-22 ENCOUNTER — Ambulatory Visit (HOSPITAL_COMMUNITY)
Admission: RE | Admit: 2024-09-22 | Discharge: 2024-09-22 | Disposition: A | Discharge status: Home / Self Care | Source: Ambulatory Visit | Attending: Surgery | Admitting: Surgery

## 2024-09-22 DIAGNOSIS — I7121 Aneurysm of the ascending aorta, without rupture: Secondary | ICD-10-CM | POA: Diagnosis present

## 2024-09-22 MED ORDER — IOHEXOL 350 MG/ML SOLN
75.0000 mL | Freq: Once | INTRAVENOUS | Status: AC | PRN
  Administered 2024-09-22: 75 mL via INTRAVENOUS

## 2024-10-04 ENCOUNTER — Ambulatory Visit

## 2024-10-04 DIAGNOSIS — I7121 Aneurysm of the ascending aorta, without rupture: Secondary | ICD-10-CM

## 2024-10-04 NOTE — Patient Instructions (Signed)

## 2024-10-04 NOTE — Progress Notes (Signed)
 "     [address] [place] [address]             [phone]       CARDIOTHORACIC SURGERY TELEPHONE VIRTUAL OFFICE NOTE  Referring Provider is Dettinger, Fonda LABOR, MD Primary Cardiologist is None PCP is Dettinger, Fonda LABOR, MD   HPI:  I spoke with Spencer Miller. (DOB 1946-03-01 ) via telephone on 10/04/2024 at 12:52 PM and verified that I was speaking with the correct person using more than one form of identification.  We discussed the fact that I was contacting them from my office in [place] and they were located at home in [place], as well as the reason(s) for conducting our visit virtually instead of in-person.  The patient expressed understanding the circumstances and agreed to proceed as described.  Spencer Miller is a 79 year old man with medical history of aortic atherosclerosis, pulmonary nodules, history of traumatic brain injury, BPH, and current tobacco user who presents for continued follow up of ascending thoracic aortic aneurysm.  Aneurysm has stayed stable in size and on recent CTA of chest measured 4.0 cm.   He reports that he has been doing well recently. He does not have a history of hypertension and his blood pressure readings are 120s/70s.  He is a current smoker of cigarettes. He is active and denies heavy lifting.     Current Outpatient Medications  Medication Sig Dispense Refill   albuterol  (PROVENTIL ) (2.5 MG/3ML) 0.083% nebulizer solution Take 3 mLs (2.5 mg total) by nebulization every 6 (six) hours as needed for wheezing or shortness of breath. 150 mL 1   albuterol  (VENTOLIN  HFA) 108 (90 Base) MCG/ACT inhaler Inhale 2 puffs into the lungs every 6 (six) hours as needed for wheezing or shortness of breath. 8 g 5   alfuzosin  (UROXATRAL ) 10 MG 24 hr tablet Take 1 tablet (10 mg total) by mouth at bedtime. 30 tablet 11   oseltamivir  (TAMIFLU ) 75 MG capsule Take 1 capsule (75 mg total) by mouth 2 (two) times daily. 10 capsule 0    predniSONE  (STERAPRED UNI-PAK 21 TAB) 10 MG (21) TBPK tablet Use as directed 21 tablet 0   No current facility-administered medications for this visit.     Diagnostic Tests: CLINICAL DATA:  Aortic aneurysm suspected   EXAM: CT ANGIOGRAPHY CHEST WITH CONTRAST   TECHNIQUE: Multidetector CT imaging of the chest was performed using the standard protocol during bolus administration of intravenous contrast. Multiplanar CT image reconstructions and MIPs were obtained to evaluate the vascular anatomy.   RADIATION DOSE REDUCTION: This exam was performed according to the departmental dose-optimization program which includes automated exposure control, adjustment of the mA and/or kV according to patient size and/or use of iterative reconstruction technique.   CONTRAST:  75mL OMNIPAQUE  IOHEXOL  350 MG/ML SOLN   COMPARISON:  Chest XR, 11/02/2018.  CT chest, 08/01/2024.   FINDINGS: CARDIOVASCULAR:   Limitations by motion: Moderate   Preferential opacification of the thoracic aorta. No evidence of thoracic aortic dissection.   Aortic Root: Motion degraded, such that the aortic root can not accurately measured.   Thoracic Aorta:   --Ascending Aorta: 4.0 cm   --Aortic Arch: 3.2 cm   --Descending Aorta: 2.3 cm   Other:   Normal heart size.  No pericardial effusion.   Severe burden multivessel calcified coronary atherosclerosis.   Conventional 3-sided LEFT aortic arch. Arch atherosclerosis without hemodynamic stenosis at the origins of  the great vessels.   Mediastinum/Nodes: No enlarged mediastinal, hilar, or axillary lymph nodes. Thyroid  gland, trachea, and esophagus demonstrate no significant findings.   Lungs/Pleura: Upper lobe predominant emphysematous lung change. Lungs are clear without focal consolidation, mass or suspicious pulmonary nodule.   Upper Abdomen: 5.5 x 4.5 x 5.5 hypodensity within the RIGHT hepatic lobe measuring 10 HU, likely with a hepatic cyst versus  hemangioma. No follow-up is recommended. No acute abnormality.   Musculoskeletal: Mild pectus deformity of the inferior sternum. No acute chest wall abnormality. No acute osseous findings.   Review of the MIP images confirms the above findings.   IMPRESSION: 1. 4.0 cm fusiform dilatation of the ascending thoracic aorta. Continue annual imaging followup by CTA or MRA. This recommendation follows 2010 ACCF/AHA/AATS/ACR/ASA/SCA/SCAI/SIR/STS/SVM Guidelines for the Diagnosis and Management of Patients with Thoracic Aortic Disease. Circulation. 2010; 121: Z733-z630. Aortic aneurysm NOS (ICD10-I71.9) 2. Severe multivessel coronary atherosclerosis and Aortic Atherosclerosis (ICD10-I70.0). 3.  Emphysema (ICD10-J43.9).  Continue lung cancer screening. [place]  Chief Financial Officer. Screening for Lung Cancer: [place]  Licensed Conveyancer. JAMA. 2021;325(10):962-970.     Electronically Signed   By: Thom Hall M.D.   On: 09/23/2024 09:30    Plan: Aneurysm of ascending aorta without rupture -4.0 cm ascending thoracic aortic aneurysm on CTA of chest.  -We discussed the natural history and and risk factors for growth of ascending aortic aneurysms. Discussed recommendations to minimize the risk of further expansion or dissection including careful blood pressure control, avoidance of contact sports and heavy lifting, attention to lipid management.  We covered the importance of smoking cessation.  The patient does not yet meet surgical criteria of >5.5cm. The patient is aware of signs and symptoms of aortic dissection and when to present to the emergency department   -Follow up in December 2026 after he has low dose CT scan for lung cancer screening. Can use this scan for aneurysm surveillance.    I discussed limitations of evaluation and management via telephone.  The patient was advised to call back for repeat telephone consultation or to seek an in-person  evaluation if questions arise or the patient's clinical condition changes in any significant manner.  I spent in excess of 20 minutes of non-face-to-face time during the conduct of this telephone virtual office consultation, including pre-visit review of the patient's records and direct conversation with the patient.   Level 1  (99441)             5-10 minutes Level 2  (99442)            11-20 minutes Level 3  (99443)            21-30 minutes   Manuelita CHRISTELLA Rough, PA-C 10/04/2024 12:52 PM  "

## 2024-11-01 ENCOUNTER — Ambulatory Visit: Payer: Self-pay | Admitting: Family Medicine

## 2024-11-22 ENCOUNTER — Ambulatory Visit: Admitting: Urology
